# Patient Record
Sex: Female | Born: 1935 | Race: White | Hispanic: No | Smoking: Former smoker
Health system: Southern US, Community
[De-identification: ages and names within clinical notes are randomized; demographics above are authoritative.]

## PROBLEM LIST (undated history)

## (undated) DIAGNOSIS — K759 Inflammatory liver disease, unspecified: Secondary | ICD-10-CM

## (undated) DIAGNOSIS — B269 Mumps without complication: Secondary | ICD-10-CM

## (undated) DIAGNOSIS — A630 Anogenital (venereal) warts: Secondary | ICD-10-CM

## (undated) DIAGNOSIS — C50919 Malignant neoplasm of unspecified site of unspecified female breast: Secondary | ICD-10-CM

## (undated) DIAGNOSIS — Z9889 Other specified postprocedural states: Secondary | ICD-10-CM

## (undated) DIAGNOSIS — G8929 Other chronic pain: Secondary | ICD-10-CM

## (undated) DIAGNOSIS — R112 Nausea with vomiting, unspecified: Secondary | ICD-10-CM

## (undated) DIAGNOSIS — M810 Age-related osteoporosis without current pathological fracture: Secondary | ICD-10-CM

## (undated) DIAGNOSIS — A389 Scarlet fever, uncomplicated: Secondary | ICD-10-CM

## (undated) DIAGNOSIS — I499 Cardiac arrhythmia, unspecified: Secondary | ICD-10-CM

## (undated) DIAGNOSIS — E039 Hypothyroidism, unspecified: Secondary | ICD-10-CM

## (undated) DIAGNOSIS — K649 Unspecified hemorrhoids: Secondary | ICD-10-CM

## (undated) DIAGNOSIS — M1990 Unspecified osteoarthritis, unspecified site: Secondary | ICD-10-CM

## (undated) DIAGNOSIS — B059 Measles without complication: Secondary | ICD-10-CM

## (undated) DIAGNOSIS — T4145XA Adverse effect of unspecified anesthetic, initial encounter: Secondary | ICD-10-CM

## (undated) DIAGNOSIS — C4490 Unspecified malignant neoplasm of skin, unspecified: Secondary | ICD-10-CM

## (undated) DIAGNOSIS — T8859XA Other complications of anesthesia, initial encounter: Secondary | ICD-10-CM

## (undated) DIAGNOSIS — K219 Gastro-esophageal reflux disease without esophagitis: Secondary | ICD-10-CM

## (undated) DIAGNOSIS — H547 Unspecified visual loss: Secondary | ICD-10-CM

## (undated) DIAGNOSIS — K589 Irritable bowel syndrome without diarrhea: Secondary | ICD-10-CM

## (undated) DIAGNOSIS — K5792 Diverticulitis of intestine, part unspecified, without perforation or abscess without bleeding: Secondary | ICD-10-CM

## (undated) DIAGNOSIS — J939 Pneumothorax, unspecified: Secondary | ICD-10-CM

## (undated) DIAGNOSIS — J189 Pneumonia, unspecified organism: Secondary | ICD-10-CM

## (undated) HISTORY — PX: BREAST LUMPECTOMY: SHX2

## (undated) HISTORY — PX: COLONOSCOPY: SHX174

## (undated) HISTORY — PX: NASAL SEPTUM SURGERY: SHX37

## (undated) HISTORY — PX: OTHER SURGICAL HISTORY: SHX169

## (undated) HISTORY — PX: BREAST EXCISIONAL BIOPSY: SUR124

---
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
## 1994-05-23 HISTORY — PX: HEMORRHOID SURGERY: SHX153

## 1996-05-23 DIAGNOSIS — J939 Pneumothorax, unspecified: Secondary | ICD-10-CM

## 1996-05-23 HISTORY — DX: Pneumothorax, unspecified: J93.9

## 1996-05-23 HISTORY — PX: ABDOMINAL HYSTERECTOMY: SHX81

## 2000-05-23 HISTORY — PX: OTHER SURGICAL HISTORY: SHX169

## 2004-05-23 DIAGNOSIS — C50919 Malignant neoplasm of unspecified site of unspecified female breast: Secondary | ICD-10-CM

## 2004-05-23 HISTORY — PX: OTHER SURGICAL HISTORY: SHX169

## 2004-05-23 HISTORY — DX: Malignant neoplasm of unspecified site of unspecified female breast: C50.919

## 2004-11-29 ENCOUNTER — Encounter: Admission: RE | Admit: 2004-11-29 | Discharge: 2004-11-29 | Payer: Self-pay | Admitting: General Surgery

## 2005-05-23 HISTORY — PX: OTHER SURGICAL HISTORY: SHX169

## 2005-05-31 ENCOUNTER — Encounter: Admission: RE | Admit: 2005-05-31 | Discharge: 2005-05-31 | Payer: Self-pay | Admitting: General Surgery

## 2005-07-13 DIAGNOSIS — C4491 Basal cell carcinoma of skin, unspecified: Secondary | ICD-10-CM

## 2005-07-13 HISTORY — DX: Basal cell carcinoma of skin, unspecified: C44.91

## 2005-07-20 ENCOUNTER — Encounter: Payer: Self-pay | Admitting: Interventional Radiology

## 2005-07-26 ENCOUNTER — Encounter (INDEPENDENT_AMBULATORY_CARE_PROVIDER_SITE_OTHER): Payer: Self-pay | Admitting: *Deleted

## 2005-07-26 ENCOUNTER — Ambulatory Visit (HOSPITAL_COMMUNITY): Admission: RE | Admit: 2005-07-26 | Discharge: 2005-07-26 | Payer: Self-pay | Admitting: Interventional Radiology

## 2005-12-01 ENCOUNTER — Encounter: Admission: RE | Admit: 2005-12-01 | Discharge: 2005-12-01 | Payer: Self-pay | Admitting: General Surgery

## 2006-12-05 ENCOUNTER — Encounter: Admission: RE | Admit: 2006-12-05 | Discharge: 2006-12-05 | Payer: Self-pay | Admitting: General Surgery

## 2007-12-06 ENCOUNTER — Encounter: Admission: RE | Admit: 2007-12-06 | Discharge: 2007-12-06 | Payer: Self-pay | Admitting: General Surgery

## 2008-12-08 ENCOUNTER — Encounter: Admission: RE | Admit: 2008-12-08 | Discharge: 2008-12-08 | Payer: Self-pay | Admitting: General Surgery

## 2009-12-09 ENCOUNTER — Encounter: Admission: RE | Admit: 2009-12-09 | Discharge: 2009-12-09 | Payer: Self-pay | Admitting: Family Medicine
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
## 2010-11-09 ENCOUNTER — Other Ambulatory Visit: Payer: Self-pay | Admitting: Family Medicine

## 2010-11-09 DIAGNOSIS — Z9889 Other specified postprocedural states: Secondary | ICD-10-CM

## 2010-11-09 DIAGNOSIS — Z853 Personal history of malignant neoplasm of breast: Secondary | ICD-10-CM

## 2010-12-13 ENCOUNTER — Ambulatory Visit
Admission: RE | Admit: 2010-12-13 | Discharge: 2010-12-13 | Disposition: A | Discharge status: Home / Self Care | Payer: Medicare Other | Source: Ambulatory Visit | Attending: Family Medicine | Admitting: Family Medicine

## 2010-12-13 DIAGNOSIS — Z9889 Other specified postprocedural states: Secondary | ICD-10-CM

## 2010-12-13 DIAGNOSIS — Z853 Personal history of malignant neoplasm of breast: Secondary | ICD-10-CM

## 2011-06-06 DIAGNOSIS — IMO0002 Reserved for concepts with insufficient information to code with codable children: Secondary | ICD-10-CM | POA: Diagnosis not present

## 2011-06-06 DIAGNOSIS — J309 Allergic rhinitis, unspecified: Secondary | ICD-10-CM | POA: Diagnosis not present

## 2011-06-06 DIAGNOSIS — E039 Hypothyroidism, unspecified: Secondary | ICD-10-CM | POA: Diagnosis not present

## 2011-06-06 DIAGNOSIS — K5730 Diverticulosis of large intestine without perforation or abscess without bleeding: Secondary | ICD-10-CM | POA: Diagnosis not present

## 2011-06-06 DIAGNOSIS — M810 Age-related osteoporosis without current pathological fracture: Secondary | ICD-10-CM | POA: Diagnosis not present

## 2011-06-06 DIAGNOSIS — M1990 Unspecified osteoarthritis, unspecified site: Secondary | ICD-10-CM | POA: Diagnosis not present

## 2011-06-06 DIAGNOSIS — I10 Essential (primary) hypertension: Secondary | ICD-10-CM | POA: Diagnosis not present

## 2011-06-06 DIAGNOSIS — E782 Mixed hyperlipidemia: Secondary | ICD-10-CM | POA: Diagnosis not present

## 2011-06-08 DIAGNOSIS — L600 Ingrowing nail: Secondary | ICD-10-CM | POA: Diagnosis not present

## 2011-06-08 DIAGNOSIS — M79609 Pain in unspecified limb: Secondary | ICD-10-CM | POA: Diagnosis not present

## 2011-06-22 DIAGNOSIS — L03039 Cellulitis of unspecified toe: Secondary | ICD-10-CM | POA: Diagnosis not present

## 2011-06-24 DIAGNOSIS — K5732 Diverticulitis of large intestine without perforation or abscess without bleeding: Secondary | ICD-10-CM | POA: Diagnosis not present

## 2011-08-01 ENCOUNTER — Other Ambulatory Visit: Payer: Self-pay | Admitting: Dermatology

## 2011-08-01 DIAGNOSIS — D485 Neoplasm of uncertain behavior of skin: Secondary | ICD-10-CM | POA: Diagnosis not present

## 2011-08-01 DIAGNOSIS — L570 Actinic keratosis: Secondary | ICD-10-CM | POA: Diagnosis not present

## 2011-08-01 DIAGNOSIS — L820 Inflamed seborrheic keratosis: Secondary | ICD-10-CM | POA: Diagnosis not present

## 2011-10-11 DIAGNOSIS — E782 Mixed hyperlipidemia: Secondary | ICD-10-CM | POA: Diagnosis not present

## 2011-10-11 DIAGNOSIS — R5383 Other fatigue: Secondary | ICD-10-CM | POA: Diagnosis not present

## 2011-10-11 DIAGNOSIS — E039 Hypothyroidism, unspecified: Secondary | ICD-10-CM | POA: Diagnosis not present

## 2011-10-11 DIAGNOSIS — I10 Essential (primary) hypertension: Secondary | ICD-10-CM | POA: Diagnosis not present

## 2011-10-18 DIAGNOSIS — M1990 Unspecified osteoarthritis, unspecified site: Secondary | ICD-10-CM | POA: Diagnosis not present

## 2011-10-18 DIAGNOSIS — J309 Allergic rhinitis, unspecified: Secondary | ICD-10-CM | POA: Diagnosis not present

## 2011-10-18 DIAGNOSIS — K5730 Diverticulosis of large intestine without perforation or abscess without bleeding: Secondary | ICD-10-CM | POA: Diagnosis not present

## 2011-10-18 DIAGNOSIS — E782 Mixed hyperlipidemia: Secondary | ICD-10-CM | POA: Diagnosis not present

## 2011-10-18 DIAGNOSIS — M810 Age-related osteoporosis without current pathological fracture: Secondary | ICD-10-CM | POA: Diagnosis not present

## 2011-10-18 DIAGNOSIS — I10 Essential (primary) hypertension: Secondary | ICD-10-CM | POA: Diagnosis not present

## 2011-10-18 DIAGNOSIS — IMO0002 Reserved for concepts with insufficient information to code with codable children: Secondary | ICD-10-CM | POA: Diagnosis not present

## 2011-10-18 DIAGNOSIS — E039 Hypothyroidism, unspecified: Secondary | ICD-10-CM | POA: Diagnosis not present

## 2011-10-21 DIAGNOSIS — L600 Ingrowing nail: Secondary | ICD-10-CM | POA: Diagnosis not present

## 2011-10-21 DIAGNOSIS — M79609 Pain in unspecified limb: Secondary | ICD-10-CM | POA: Diagnosis not present

## 2011-11-08 ENCOUNTER — Other Ambulatory Visit: Payer: Self-pay | Admitting: Family Medicine

## 2011-11-08 DIAGNOSIS — Z853 Personal history of malignant neoplasm of breast: Secondary | ICD-10-CM

## 2011-11-11 DIAGNOSIS — L600 Ingrowing nail: Secondary | ICD-10-CM | POA: Diagnosis not present

## 2011-11-11 DIAGNOSIS — M79609 Pain in unspecified limb: Secondary | ICD-10-CM | POA: Diagnosis not present

## 2011-12-02 DIAGNOSIS — L600 Ingrowing nail: Secondary | ICD-10-CM | POA: Diagnosis not present

## 2011-12-02 DIAGNOSIS — M79609 Pain in unspecified limb: Secondary | ICD-10-CM | POA: Diagnosis not present

## 2011-12-14 ENCOUNTER — Ambulatory Visit
Admission: RE | Admit: 2011-12-14 | Discharge: 2011-12-14 | Disposition: A | Discharge status: Home / Self Care | Payer: Medicare Other | Source: Ambulatory Visit | Attending: Family Medicine | Admitting: Family Medicine

## 2011-12-14 DIAGNOSIS — Z853 Personal history of malignant neoplasm of breast: Secondary | ICD-10-CM

## 2011-12-19 DIAGNOSIS — E039 Hypothyroidism, unspecified: Secondary | ICD-10-CM | POA: Diagnosis not present

## 2011-12-19 DIAGNOSIS — J309 Allergic rhinitis, unspecified: Secondary | ICD-10-CM | POA: Diagnosis not present

## 2011-12-19 DIAGNOSIS — I10 Essential (primary) hypertension: Secondary | ICD-10-CM | POA: Diagnosis not present

## 2011-12-19 DIAGNOSIS — E782 Mixed hyperlipidemia: Secondary | ICD-10-CM | POA: Diagnosis not present

## 2011-12-19 DIAGNOSIS — M1990 Unspecified osteoarthritis, unspecified site: Secondary | ICD-10-CM | POA: Diagnosis not present

## 2011-12-19 DIAGNOSIS — M810 Age-related osteoporosis without current pathological fracture: Secondary | ICD-10-CM | POA: Diagnosis not present

## 2011-12-19 DIAGNOSIS — K5730 Diverticulosis of large intestine without perforation or abscess without bleeding: Secondary | ICD-10-CM | POA: Diagnosis not present

## 2011-12-19 DIAGNOSIS — IMO0002 Reserved for concepts with insufficient information to code with codable children: Secondary | ICD-10-CM | POA: Diagnosis not present

## 2012-02-23 DIAGNOSIS — Z23 Encounter for immunization: Secondary | ICD-10-CM | POA: Diagnosis not present

## 2012-03-21 DIAGNOSIS — E782 Mixed hyperlipidemia: Secondary | ICD-10-CM | POA: Diagnosis not present

## 2012-03-21 DIAGNOSIS — M1990 Unspecified osteoarthritis, unspecified site: Secondary | ICD-10-CM | POA: Diagnosis not present

## 2012-03-21 DIAGNOSIS — I10 Essential (primary) hypertension: Secondary | ICD-10-CM | POA: Diagnosis not present

## 2012-03-21 DIAGNOSIS — K5730 Diverticulosis of large intestine without perforation or abscess without bleeding: Secondary | ICD-10-CM | POA: Diagnosis not present

## 2012-03-21 DIAGNOSIS — IMO0002 Reserved for concepts with insufficient information to code with codable children: Secondary | ICD-10-CM | POA: Diagnosis not present

## 2012-03-21 DIAGNOSIS — M810 Age-related osteoporosis without current pathological fracture: Secondary | ICD-10-CM | POA: Diagnosis not present

## 2012-03-21 DIAGNOSIS — E039 Hypothyroidism, unspecified: Secondary | ICD-10-CM | POA: Diagnosis not present

## 2012-03-21 DIAGNOSIS — J309 Allergic rhinitis, unspecified: Secondary | ICD-10-CM | POA: Diagnosis not present

## 2012-05-03 DIAGNOSIS — J309 Allergic rhinitis, unspecified: Secondary | ICD-10-CM | POA: Diagnosis not present

## 2012-05-03 DIAGNOSIS — M810 Age-related osteoporosis without current pathological fracture: Secondary | ICD-10-CM | POA: Diagnosis not present

## 2012-05-03 DIAGNOSIS — K5730 Diverticulosis of large intestine without perforation or abscess without bleeding: Secondary | ICD-10-CM | POA: Diagnosis not present

## 2012-05-03 DIAGNOSIS — I10 Essential (primary) hypertension: Secondary | ICD-10-CM | POA: Diagnosis not present

## 2012-05-03 DIAGNOSIS — E782 Mixed hyperlipidemia: Secondary | ICD-10-CM | POA: Diagnosis not present

## 2012-05-03 DIAGNOSIS — IMO0002 Reserved for concepts with insufficient information to code with codable children: Secondary | ICD-10-CM | POA: Diagnosis not present

## 2012-05-03 DIAGNOSIS — M1990 Unspecified osteoarthritis, unspecified site: Secondary | ICD-10-CM | POA: Diagnosis not present

## 2012-05-03 DIAGNOSIS — E039 Hypothyroidism, unspecified: Secondary | ICD-10-CM | POA: Diagnosis not present

## 2012-07-10 ENCOUNTER — Other Ambulatory Visit: Payer: Self-pay | Admitting: Dermatology

## 2012-07-10 DIAGNOSIS — C44319 Basal cell carcinoma of skin of other parts of face: Secondary | ICD-10-CM | POA: Diagnosis not present

## 2012-07-10 DIAGNOSIS — L570 Actinic keratosis: Secondary | ICD-10-CM | POA: Diagnosis not present

## 2012-07-10 DIAGNOSIS — C4491 Basal cell carcinoma of skin, unspecified: Secondary | ICD-10-CM

## 2012-07-10 DIAGNOSIS — L820 Inflamed seborrheic keratosis: Secondary | ICD-10-CM | POA: Diagnosis not present

## 2012-07-10 HISTORY — DX: Basal cell carcinoma of skin, unspecified: C44.91

## 2012-07-20 DIAGNOSIS — E039 Hypothyroidism, unspecified: Secondary | ICD-10-CM | POA: Diagnosis not present

## 2012-07-20 DIAGNOSIS — I10 Essential (primary) hypertension: Secondary | ICD-10-CM | POA: Diagnosis not present

## 2012-07-20 DIAGNOSIS — E782 Mixed hyperlipidemia: Secondary | ICD-10-CM | POA: Diagnosis not present

## 2012-08-03 DIAGNOSIS — I739 Peripheral vascular disease, unspecified: Secondary | ICD-10-CM | POA: Diagnosis not present

## 2012-08-31 DIAGNOSIS — B351 Tinea unguium: Secondary | ICD-10-CM | POA: Diagnosis not present

## 2012-08-31 DIAGNOSIS — I70209 Unspecified atherosclerosis of native arteries of extremities, unspecified extremity: Secondary | ICD-10-CM | POA: Diagnosis not present

## 2012-08-31 DIAGNOSIS — L851 Acquired keratosis [keratoderma] palmaris et plantaris: Secondary | ICD-10-CM | POA: Diagnosis not present

## 2012-10-09 DIAGNOSIS — H2510 Age-related nuclear cataract, unspecified eye: Secondary | ICD-10-CM | POA: Diagnosis not present

## 2012-10-09 DIAGNOSIS — H25019 Cortical age-related cataract, unspecified eye: Secondary | ICD-10-CM | POA: Diagnosis not present

## 2012-11-06 DIAGNOSIS — L821 Other seborrheic keratosis: Secondary | ICD-10-CM | POA: Diagnosis not present

## 2012-11-06 DIAGNOSIS — D239 Other benign neoplasm of skin, unspecified: Secondary | ICD-10-CM | POA: Diagnosis not present

## 2012-11-08 ENCOUNTER — Other Ambulatory Visit: Payer: Self-pay | Admitting: Family Medicine

## 2012-11-08 DIAGNOSIS — Z853 Personal history of malignant neoplasm of breast: Secondary | ICD-10-CM

## 2012-11-09 DIAGNOSIS — L851 Acquired keratosis [keratoderma] palmaris et plantaris: Secondary | ICD-10-CM | POA: Diagnosis not present

## 2012-11-09 DIAGNOSIS — B351 Tinea unguium: Secondary | ICD-10-CM | POA: Diagnosis not present

## 2012-11-09 DIAGNOSIS — I70209 Unspecified atherosclerosis of native arteries of extremities, unspecified extremity: Secondary | ICD-10-CM | POA: Diagnosis not present

## 2012-12-03 DIAGNOSIS — K589 Irritable bowel syndrome without diarrhea: Secondary | ICD-10-CM | POA: Diagnosis not present

## 2012-12-03 DIAGNOSIS — M810 Age-related osteoporosis without current pathological fracture: Secondary | ICD-10-CM | POA: Diagnosis not present

## 2012-12-03 DIAGNOSIS — I10 Essential (primary) hypertension: Secondary | ICD-10-CM | POA: Diagnosis not present

## 2012-12-03 DIAGNOSIS — M1990 Unspecified osteoarthritis, unspecified site: Secondary | ICD-10-CM | POA: Diagnosis not present

## 2012-12-03 DIAGNOSIS — IMO0002 Reserved for concepts with insufficient information to code with codable children: Secondary | ICD-10-CM | POA: Diagnosis not present

## 2012-12-03 DIAGNOSIS — E782 Mixed hyperlipidemia: Secondary | ICD-10-CM | POA: Diagnosis not present

## 2012-12-03 DIAGNOSIS — E039 Hypothyroidism, unspecified: Secondary | ICD-10-CM | POA: Diagnosis not present

## 2012-12-03 DIAGNOSIS — I739 Peripheral vascular disease, unspecified: Secondary | ICD-10-CM | POA: Diagnosis not present

## 2012-12-12 DIAGNOSIS — M1710 Unilateral primary osteoarthritis, unspecified knee: Secondary | ICD-10-CM | POA: Diagnosis not present

## 2012-12-14 ENCOUNTER — Ambulatory Visit
Admission: RE | Admit: 2012-12-14 | Discharge: 2012-12-14 | Disposition: A | Discharge status: Home / Self Care | Payer: Medicare Other | Source: Ambulatory Visit | Attending: Family Medicine | Admitting: Family Medicine

## 2012-12-14 DIAGNOSIS — Z853 Personal history of malignant neoplasm of breast: Secondary | ICD-10-CM | POA: Diagnosis not present

## 2013-01-15 DIAGNOSIS — M1710 Unilateral primary osteoarthritis, unspecified knee: Secondary | ICD-10-CM | POA: Diagnosis not present

## 2013-02-01 DIAGNOSIS — I70209 Unspecified atherosclerosis of native arteries of extremities, unspecified extremity: Secondary | ICD-10-CM | POA: Diagnosis not present

## 2013-02-01 DIAGNOSIS — L851 Acquired keratosis [keratoderma] palmaris et plantaris: Secondary | ICD-10-CM | POA: Diagnosis not present

## 2013-02-01 DIAGNOSIS — B351 Tinea unguium: Secondary | ICD-10-CM | POA: Diagnosis not present

## 2013-02-13 DIAGNOSIS — Z23 Encounter for immunization: Secondary | ICD-10-CM | POA: Diagnosis not present

## 2013-02-15 ENCOUNTER — Encounter (HOSPITAL_COMMUNITY): Payer: Self-pay | Admitting: Pharmacy Technician

## 2013-02-15 NOTE — Progress Notes (Signed)
Need orders in EPIC.  Surgery scheduled for 03/04/13.  preop on 02/21/13 at 200pm.  Thank You.

## 2013-02-18 ENCOUNTER — Other Ambulatory Visit: Payer: Self-pay | Admitting: Orthopedic Surgery

## 2013-02-20 NOTE — Patient Instructions (Addendum)
20 WILSON SAMPLE  02/20/2013   Your procedure is scheduled on: 03-04-2013  Report to [facility] at 1205 PM  Call this number if you have problems the morning of surgery [phone]   Remember: you will  Have you blood type drawn day of surgery in short stay.    Do not eat food  :After Midnight.   clear liquids midnight until 905 am day of surgery, then nothing by mouth.   Take these medicines the morning of surgery with A SIP OF WATER: levothryoid                                [facility] FOR [facility]             You may not have any metal on your body including hair pins and piercings  Do not wear jewelry, make-up.  Do not wear lotions, powders, or perfumes. You may wear deodorant.   Men may shave face and neck.  Do not bring valuables to the hospital. [facility] IS NOT RESPONSIBLE FOR VALUEABLES.  Contacts, dentures or bridgework may not be worn into surgery.  Leave suitcase in the car. After surgery it may be brought to your room.  For patients admitted to the hospital, checkout time is 11:00 AM the day of discharge.   Patients discharged the day of surgery will not be allowed to drive home.  Name and phone number of your driver:  Special Instructions: N/A   Please read over the following fact sheets that you were given:   Call Cain Sieve RN pre op nurse if needed [phone]    FAILURE TO FOLLOW THESE INSTRUCTIONS MAY RESULT IN THE CANCELLATION OF YOUR SURGERY.  PATIENT SIGNATURE___________________________________________  NURSE SIGNATURE_____________________________________________

## 2013-02-21 ENCOUNTER — Encounter (HOSPITAL_COMMUNITY): Payer: Self-pay

## 2013-02-21 ENCOUNTER — Encounter (HOSPITAL_COMMUNITY)
Admission: RE | Admit: 2013-02-21 | Discharge: 2013-02-21 | Disposition: A | Discharge status: Home / Self Care | Payer: Medicare Other | Source: Ambulatory Visit | Attending: Orthopedic Surgery | Admitting: Orthopedic Surgery

## 2013-02-21 ENCOUNTER — Ambulatory Visit (HOSPITAL_COMMUNITY)
Admission: RE | Admit: 2013-02-21 | Discharge: 2013-02-21 | Disposition: A | Discharge status: Home / Self Care | Payer: Medicare Other | Source: Ambulatory Visit | Attending: Orthopedic Surgery | Admitting: Orthopedic Surgery

## 2013-02-21 DIAGNOSIS — I771 Stricture of artery: Secondary | ICD-10-CM | POA: Diagnosis not present

## 2013-02-21 DIAGNOSIS — M1710 Unilateral primary osteoarthritis, unspecified knee: Secondary | ICD-10-CM | POA: Insufficient documentation

## 2013-02-21 DIAGNOSIS — M4120 Other idiopathic scoliosis, site unspecified: Secondary | ICD-10-CM | POA: Diagnosis not present

## 2013-02-21 DIAGNOSIS — Z01810 Encounter for preprocedural cardiovascular examination: Secondary | ICD-10-CM | POA: Insufficient documentation

## 2013-02-21 DIAGNOSIS — Z01812 Encounter for preprocedural laboratory examination: Secondary | ICD-10-CM | POA: Diagnosis not present

## 2013-02-21 DIAGNOSIS — Z01818 Encounter for other preprocedural examination: Secondary | ICD-10-CM | POA: Diagnosis not present

## 2013-02-21 HISTORY — DX: Pneumothorax, unspecified: J93.9

## 2013-02-21 HISTORY — DX: Scarlet fever, uncomplicated: A38.9

## 2013-02-21 HISTORY — DX: Unspecified visual loss: H54.7

## 2013-02-21 HISTORY — DX: Malignant neoplasm of unspecified site of unspecified female breast: C50.919

## 2013-02-21 HISTORY — DX: Other chronic pain: G89.29

## 2013-02-21 HISTORY — DX: Other specified postprocedural states: Z98.890

## 2013-02-21 HISTORY — DX: Nausea with vomiting, unspecified: R11.2

## 2013-02-21 HISTORY — DX: Measles without complication: B05.9

## 2013-02-21 HISTORY — DX: Age-related osteoporosis without current pathological fracture: M81.0

## 2013-02-21 HISTORY — DX: Diverticulitis of intestine, part unspecified, without perforation or abscess without bleeding: K57.92

## 2013-02-21 HISTORY — DX: Unspecified osteoarthritis, unspecified site: M19.90

## 2013-02-21 HISTORY — DX: Inflammatory liver disease, unspecified: K75.9

## 2013-02-21 HISTORY — DX: Unspecified malignant neoplasm of skin, unspecified: C44.90

## 2013-02-21 HISTORY — DX: Unspecified hemorrhoids: K64.9

## 2013-02-21 HISTORY — DX: Gastro-esophageal reflux disease without esophagitis: K21.9

## 2013-02-21 HISTORY — DX: Mumps without complication: B26.9

## 2013-02-21 HISTORY — DX: Adverse effect of unspecified anesthetic, initial encounter: T41.45XA

## 2013-02-21 HISTORY — DX: Other complications of anesthesia, initial encounter: T88.59XA

## 2013-02-21 HISTORY — DX: Anogenital (venereal) warts: A63.0

## 2013-02-21 HISTORY — DX: Hypothyroidism, unspecified: E03.9

## 2013-02-21 HISTORY — DX: Irritable bowel syndrome, unspecified: K58.9

## 2013-02-21 LAB — CBC
Hemoglobin: 12.3 g/dL (ref 12.0–15.0)
MCH: 29.1 pg (ref 26.0–34.0)
Platelets: 253 10*3/uL (ref 150–400)
RBC: 4.22 MIL/uL (ref 3.87–5.11)
RDW: 13.8 % (ref 11.5–15.5)
WBC: 6.7 10*3/uL (ref 4.0–10.5)

## 2013-02-21 LAB — COMPREHENSIVE METABOLIC PANEL
ALT: 16 U/L (ref 0–35)
AST: 16 U/L (ref 0–37)
Albumin: 3.4 g/dL — ABNORMAL LOW (ref 3.5–5.2)
Alkaline Phosphatase: 85 U/L (ref 39–117)
CO2: 29 mEq/L (ref 19–32)
Calcium: 9.4 mg/dL (ref 8.4–10.5)
GFR calc non Af Amer: 57 mL/min — ABNORMAL LOW (ref >90)
Glucose, Bld: 94 mg/dL (ref 70–99)
Potassium: 4.2 mEq/L (ref 3.5–5.1)
Sodium: 140 mEq/L (ref 135–145)
Total Protein: 7.3 g/dL (ref 6.0–8.3)

## 2013-02-21 LAB — URINE MICROSCOPIC-ADD ON

## 2013-02-21 LAB — SURGICAL PCR SCREEN
MRSA, PCR: NEGATIVE
Staphylococcus aureus: NEGATIVE

## 2013-02-21 LAB — URINALYSIS, ROUTINE W REFLEX MICROSCOPIC
Glucose, UA: NEGATIVE mg/dL
Hgb urine dipstick: NEGATIVE
Ketones, ur: NEGATIVE mg/dL
Nitrite: NEGATIVE
Specific Gravity, Urine: 1.016 (ref 1.005–1.030)
pH: 6 (ref 5.0–8.0)

## 2013-02-21 LAB — PROTIME-INR: INR: 0.92 (ref 0.00–1.49)

## 2013-02-21 LAB — APTT: aPTT: 32 seconds (ref 24–37)

## 2013-02-21 NOTE — Progress Notes (Signed)
Micro ua results faxed by epic to dr aluisio  

## 2013-02-25 NOTE — Progress Notes (Signed)
FAX RECEIVED AND PLACED ON CHART NO ACTION ON MICRO UA RESULTS PER DREW PERKINS PA

## 2013-02-27 NOTE — Progress Notes (Signed)
Spoke with pt son mark Deeley made aware surgery time changed to 820 am, npo after midnight arrive 600 am 03-04-13  [facility] short stay. No answer at patient phone home number.

## 2013-03-03 ENCOUNTER — Other Ambulatory Visit: Payer: Self-pay | Admitting: Orthopedic Surgery
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
## 2013-03-04 ENCOUNTER — Encounter (HOSPITAL_COMMUNITY): Payer: Medicare Other | Admitting: Anesthesiology

## 2013-03-04 ENCOUNTER — Encounter (HOSPITAL_COMMUNITY)
Admission: RE | Disposition: A | Discharge status: Home Health | Payer: Self-pay | Source: Ambulatory Visit | Attending: Orthopedic Surgery

## 2013-03-04 ENCOUNTER — Inpatient Hospital Stay (HOSPITAL_COMMUNITY)
Admission: RE | Admit: 2013-03-04 | Discharge: 2013-03-06 | DRG: 470 | Disposition: A | Discharge status: Home Health | Payer: Medicare Other | Source: Ambulatory Visit | Attending: Orthopedic Surgery | Admitting: Orthopedic Surgery

## 2013-03-04 ENCOUNTER — Encounter (HOSPITAL_COMMUNITY): Payer: Self-pay | Admitting: *Deleted

## 2013-03-04 ENCOUNTER — Inpatient Hospital Stay (HOSPITAL_COMMUNITY): Payer: Medicare Other | Admitting: Anesthesiology

## 2013-03-04 DIAGNOSIS — IMO0002 Reserved for concepts with insufficient information to code with codable children: Secondary | ICD-10-CM | POA: Diagnosis not present

## 2013-03-04 DIAGNOSIS — Z853 Personal history of malignant neoplasm of breast: Secondary | ICD-10-CM

## 2013-03-04 DIAGNOSIS — Z87891 Personal history of nicotine dependence: Secondary | ICD-10-CM | POA: Diagnosis not present

## 2013-03-04 DIAGNOSIS — M1710 Unilateral primary osteoarthritis, unspecified knee: Secondary | ICD-10-CM | POA: Diagnosis not present

## 2013-03-04 DIAGNOSIS — G8929 Other chronic pain: Secondary | ICD-10-CM | POA: Diagnosis not present

## 2013-03-04 DIAGNOSIS — H5460 Unqualified visual loss, one eye, unspecified: Secondary | ICD-10-CM | POA: Diagnosis present

## 2013-03-04 DIAGNOSIS — E039 Hypothyroidism, unspecified: Secondary | ICD-10-CM | POA: Diagnosis not present

## 2013-03-04 DIAGNOSIS — M179 Osteoarthritis of knee, unspecified: Secondary | ICD-10-CM | POA: Diagnosis present

## 2013-03-04 DIAGNOSIS — M810 Age-related osteoporosis without current pathological fracture: Secondary | ICD-10-CM | POA: Diagnosis present

## 2013-03-04 DIAGNOSIS — I10 Essential (primary) hypertension: Secondary | ICD-10-CM | POA: Diagnosis present

## 2013-03-04 DIAGNOSIS — M25569 Pain in unspecified knee: Secondary | ICD-10-CM | POA: Diagnosis not present

## 2013-03-04 DIAGNOSIS — K589 Irritable bowel syndrome without diarrhea: Secondary | ICD-10-CM | POA: Diagnosis not present

## 2013-03-04 DIAGNOSIS — Z96651 Presence of right artificial knee joint: Secondary | ICD-10-CM

## 2013-03-04 DIAGNOSIS — K5732 Diverticulitis of large intestine without perforation or abscess without bleeding: Secondary | ICD-10-CM | POA: Insufficient documentation

## 2013-03-04 DIAGNOSIS — K219 Gastro-esophageal reflux disease without esophagitis: Secondary | ICD-10-CM | POA: Diagnosis not present

## 2013-03-04 HISTORY — PX: TOTAL KNEE ARTHROPLASTY: SHX125

## 2013-03-04 LAB — ABO/RH: ABO/RH(D): A POS

## 2013-03-04 LAB — TYPE AND SCREEN

## 2013-03-04 SURGERY — ARTHROPLASTY, KNEE, TOTAL
Anesthesia: Spinal | Site: Knee | Laterality: Right

## 2013-03-04 MED ORDER — HYDROMORPHONE HCL PF 1 MG/ML IJ SOLN: 0.2500 mg | INTRAMUSCULAR | Status: DC | PRN

## 2013-03-04 MED ORDER — DIPHENHYDRAMINE HCL 12.5 MG/5ML PO ELIX: 12.5000 mg | ORAL_SOLUTION | ORAL | Status: DC | PRN

## 2013-03-04 MED ORDER — CEFAZOLIN SODIUM-DEXTROSE 2-3 GM-% IV SOLR
2.0000 g | INTRAVENOUS | Status: AC
  Administered 2013-03-04: 2 g via INTRAVENOUS

## 2013-03-04 MED ORDER — DEXTROSE-NACL 5-0.9 % IV SOLN
INTRAVENOUS | Status: DC
  Administered 2013-03-04 – 2013-03-05 (×3): via INTRAVENOUS

## 2013-03-04 MED ORDER — BUPIVACAINE HCL 0.25 % IJ SOLN
INTRAMUSCULAR | Status: DC | PRN
  Administered 2013-03-04: 30 mL

## 2013-03-04 MED ORDER — TRAMADOL HCL 50 MG PO TABS: 50.0000 mg | ORAL_TABLET | Freq: Four times a day (QID) | ORAL | Status: DC | PRN

## 2013-03-04 MED ORDER — CEFAZOLIN SODIUM-DEXTROSE 2-3 GM-% IV SOLR
INTRAVENOUS | Status: AC
  Filled 2013-03-04: qty 50

## 2013-03-04 MED ORDER — SODIUM CHLORIDE 0.9 % IJ SOLN
INTRAMUSCULAR | Status: AC
  Filled 2013-03-04: qty 50

## 2013-03-04 MED ORDER — LORATADINE 10 MG PO TABS
10.0000 mg | ORAL_TABLET | Freq: Every day | ORAL | Status: DC
  Administered 2013-03-06: 10 mg via ORAL
  Filled 2013-03-04 (×3): qty 1

## 2013-03-04 MED ORDER — CHLORHEXIDINE GLUCONATE 4 % EX LIQD
60.0000 mL | Freq: Once | CUTANEOUS | Status: DC
  Filled 2013-03-04: qty 60

## 2013-03-04 MED ORDER — 0.9 % SODIUM CHLORIDE (POUR BTL) OPTIME
TOPICAL | Status: DC | PRN
  Administered 2013-03-04: 1000 mL

## 2013-03-04 MED ORDER — FLEET ENEMA 7-19 GM/118ML RE ENEM: 1.0000 | ENEMA | Freq: Once | RECTAL | Status: AC | PRN

## 2013-03-04 MED ORDER — POLYETHYLENE GLYCOL 3350 17 G PO PACK: 17.0000 g | PACK | Freq: Every day | ORAL | Status: DC | PRN

## 2013-03-04 MED ORDER — BUPIVACAINE LIPOSOME 1.3 % IJ SUSP
20.0000 mL | Freq: Once | INTRAMUSCULAR | Status: DC
  Filled 2013-03-04: qty 20

## 2013-03-04 MED ORDER — LACTATED RINGERS IV SOLN
INTRAVENOUS | Status: DC | PRN
  Administered 2013-03-04 (×2): via INTRAVENOUS

## 2013-03-04 MED ORDER — SODIUM CHLORIDE 0.9 % IV SOLN: INTRAVENOUS | Status: DC

## 2013-03-04 MED ORDER — ACETAMINOPHEN 500 MG PO TABS
1000.0000 mg | ORAL_TABLET | Freq: Four times a day (QID) | ORAL | Status: AC
  Administered 2013-03-04 – 2013-03-05 (×4): 1000 mg via ORAL
  Filled 2013-03-04 (×4): qty 2

## 2013-03-04 MED ORDER — METHOCARBAMOL 500 MG PO TABS
500.0000 mg | ORAL_TABLET | Freq: Four times a day (QID) | ORAL | Status: DC | PRN
  Administered 2013-03-05 (×3): 500 mg via ORAL
  Filled 2013-03-04 (×3): qty 1

## 2013-03-04 MED ORDER — DEXTROSE 5 % IV SOLN
500.0000 mg | Freq: Four times a day (QID) | INTRAVENOUS | Status: DC | PRN
  Administered 2013-03-04: 500 mg via INTRAVENOUS
  Filled 2013-03-04: qty 5

## 2013-03-04 MED ORDER — AMITRIPTYLINE HCL 10 MG PO TABS
20.0000 mg | ORAL_TABLET | Freq: Every day | ORAL | Status: DC
  Administered 2013-03-04 – 2013-03-05 (×2): 20 mg via ORAL
  Filled 2013-03-04: qty 2
  Filled 2013-03-04: qty 1
  Filled 2013-03-04 (×2): qty 2

## 2013-03-04 MED ORDER — SODIUM CHLORIDE 0.9 % IJ SOLN
INTRAMUSCULAR | Status: DC | PRN
  Administered 2013-03-04: 09:00:00

## 2013-03-04 MED ORDER — OXYCODONE HCL 5 MG PO TABS
5.0000 mg | ORAL_TABLET | ORAL | Status: DC | PRN
  Administered 2013-03-04 – 2013-03-06 (×10): 10 mg via ORAL
  Filled 2013-03-04 (×10): qty 2

## 2013-03-04 MED ORDER — MORPHINE SULFATE 2 MG/ML IJ SOLN
1.0000 mg | INTRAMUSCULAR | Status: DC | PRN
  Administered 2013-03-04: 13:00:00 2 mg via INTRAVENOUS
  Filled 2013-03-04: qty 1

## 2013-03-04 MED ORDER — LEVOTHYROXINE SODIUM 75 MCG PO TABS
75.0000 ug | ORAL_TABLET | Freq: Every day | ORAL | Status: DC
  Administered 2013-03-05 – 2013-03-06 (×2): 75 ug via ORAL
  Filled 2013-03-04 (×3): qty 1

## 2013-03-04 MED ORDER — DEXAMETHASONE 6 MG PO TABS
10.0000 mg | ORAL_TABLET | Freq: Every day | ORAL | Status: AC
  Administered 2013-03-05: 10 mg via ORAL
  Filled 2013-03-04: qty 1

## 2013-03-04 MED ORDER — CEFAZOLIN SODIUM 1-5 GM-% IV SOLN
1.0000 g | Freq: Four times a day (QID) | INTRAVENOUS | Status: AC
  Administered 2013-03-04 (×2): 1 g via INTRAVENOUS
  Filled 2013-03-04 (×2): qty 50

## 2013-03-04 MED ORDER — BUPIVACAINE HCL (PF) 0.25 % IJ SOLN
INTRAMUSCULAR | Status: AC
  Filled 2013-03-04: qty 30

## 2013-03-04 MED ORDER — BUPIVACAINE IN DEXTROSE 0.75-8.25 % IT SOLN
INTRATHECAL | Status: DC | PRN
  Administered 2013-03-04: 2 mL via INTRATHECAL

## 2013-03-04 MED ORDER — METOCLOPRAMIDE HCL 10 MG PO TABS: 5.0000 mg | ORAL_TABLET | Freq: Three times a day (TID) | ORAL | Status: DC | PRN

## 2013-03-04 MED ORDER — ONDANSETRON HCL 4 MG/2ML IJ SOLN
INTRAMUSCULAR | Status: DC | PRN
  Administered 2013-03-04: 4 mg via INTRAMUSCULAR

## 2013-03-04 MED ORDER — KETOROLAC TROMETHAMINE 15 MG/ML IJ SOLN: 7.5000 mg | Freq: Four times a day (QID) | INTRAMUSCULAR | Status: AC | PRN

## 2013-03-04 MED ORDER — PROMETHAZINE HCL 25 MG/ML IJ SOLN: 6.2500 mg | INTRAMUSCULAR | Status: DC | PRN

## 2013-03-04 MED ORDER — PROPOFOL INFUSION 10 MG/ML OPTIME
INTRAVENOUS | Status: DC | PRN
  Administered 2013-03-04: 50 ug/kg/min via INTRAVENOUS

## 2013-03-04 MED ORDER — MENTHOL 3 MG MT LOZG: 1.0000 | LOZENGE | OROMUCOSAL | Status: DC | PRN

## 2013-03-04 MED ORDER — METOCLOPRAMIDE HCL 5 MG/ML IJ SOLN: 5.0000 mg | Freq: Three times a day (TID) | INTRAMUSCULAR | Status: DC | PRN

## 2013-03-04 MED ORDER — DEXAMETHASONE SODIUM PHOSPHATE 10 MG/ML IJ SOLN
10.0000 mg | Freq: Once | INTRAMUSCULAR | Status: AC
  Administered 2013-03-04: 10 mg via INTRAVENOUS

## 2013-03-04 MED ORDER — DEXAMETHASONE SODIUM PHOSPHATE 10 MG/ML IJ SOLN
10.0000 mg | Freq: Every day | INTRAMUSCULAR | Status: AC
  Filled 2013-03-04: qty 1

## 2013-03-04 MED ORDER — SODIUM CHLORIDE 0.9 % IR SOLN
Status: DC | PRN
  Administered 2013-03-04: 1000 mL

## 2013-03-04 MED ORDER — PHENOL 1.4 % MT LIQD: 1.0000 | OROMUCOSAL | Status: DC | PRN

## 2013-03-04 MED ORDER — METOPROLOL SUCCINATE ER 100 MG PO TB24
100.0000 mg | ORAL_TABLET | Freq: Every evening | ORAL | Status: DC
  Administered 2013-03-05: 100 mg via ORAL
  Filled 2013-03-04 (×3): qty 1

## 2013-03-04 MED ORDER — ONDANSETRON HCL 4 MG PO TABS: 4.0000 mg | ORAL_TABLET | Freq: Four times a day (QID) | ORAL | Status: DC | PRN

## 2013-03-04 MED ORDER — BISACODYL 10 MG RE SUPP: 10.0000 mg | Freq: Every day | RECTAL | Status: DC | PRN

## 2013-03-04 MED ORDER — MIDAZOLAM HCL 5 MG/5ML IJ SOLN
INTRAMUSCULAR | Status: DC | PRN
  Administered 2013-03-04 (×2): 1 mg via INTRAVENOUS

## 2013-03-04 MED ORDER — RIVAROXABAN 10 MG PO TABS
10.0000 mg | ORAL_TABLET | Freq: Every day | ORAL | Status: DC
  Administered 2013-03-05 – 2013-03-06 (×2): 10 mg via ORAL
  Filled 2013-03-04 (×3): qty 1

## 2013-03-04 MED ORDER — FAMOTIDINE 20 MG PO TABS
20.0000 mg | ORAL_TABLET | Freq: Every day | ORAL | Status: DC
  Administered 2013-03-06: 20 mg via ORAL
  Filled 2013-03-04 (×3): qty 1

## 2013-03-04 MED ORDER — ACETAMINOPHEN 500 MG PO TABS
1000.0000 mg | ORAL_TABLET | Freq: Once | ORAL | Status: AC
  Administered 2013-03-04: 1000 mg via ORAL
  Filled 2013-03-04: qty 2

## 2013-03-04 MED ORDER — ONDANSETRON HCL 4 MG/2ML IJ SOLN: 4.0000 mg | Freq: Four times a day (QID) | INTRAMUSCULAR | Status: DC | PRN

## 2013-03-04 MED ORDER — DOCUSATE SODIUM 100 MG PO CAPS
100.0000 mg | ORAL_CAPSULE | Freq: Two times a day (BID) | ORAL | Status: DC
  Administered 2013-03-05 – 2013-03-06 (×3): 100 mg via ORAL

## 2013-03-04 MED ORDER — EPHEDRINE SULFATE 50 MG/ML IJ SOLN
INTRAMUSCULAR | Status: DC | PRN
  Administered 2013-03-04 (×2): 5 mg via INTRAVENOUS

## 2013-03-04 SURGICAL SUPPLY — 57 items
BAG SPEC THK2 15X12 ZIP CLS (MISCELLANEOUS) ×1
BAG ZIPLOCK 12X15 (MISCELLANEOUS) ×2 IMPLANT
BANDAGE ELASTIC 6 VELCRO ST LF (GAUZE/BANDAGES/DRESSINGS) ×2 IMPLANT
BANDAGE ESMARK 6X9 LF (GAUZE/BANDAGES/DRESSINGS) ×1 IMPLANT
BLADE SAG 18X100X1.27 (BLADE) ×2 IMPLANT
BLADE SAW SGTL 11.0X1.19X90.0M (BLADE) ×2 IMPLANT
BNDG CMPR 9X6 STRL LF SNTH (GAUZE/BANDAGES/DRESSINGS) ×1
BNDG ESMARK 6X9 LF (GAUZE/BANDAGES/DRESSINGS) ×2
BOWL SMART MIX CTS (DISPOSABLE) ×2 IMPLANT
CAPT RP KNEE ×1 IMPLANT
CEMENT HV SMART SET (Cement) ×4 IMPLANT
CLOTH BEACON ORANGE TIMEOUT ST (SAFETY) ×1 IMPLANT
CUFF TOURN SGL QUICK 34 (TOURNIQUET CUFF) ×2
CUFF TRNQT CYL 34X4X40X1 (TOURNIQUET CUFF) ×1 IMPLANT
DECANTER SPIKE VIAL GLASS SM (MISCELLANEOUS) ×2 IMPLANT
DRAPE EXTREMITY T 121X128X90 (DRAPE) ×2 IMPLANT
DRAPE POUCH INSTRU U-SHP 10X18 (DRAPES) ×2 IMPLANT
DRAPE U-SHAPE 47X51 STRL (DRAPES) ×2 IMPLANT
DRSG ADAPTIC 3X8 NADH LF (GAUZE/BANDAGES/DRESSINGS) ×2 IMPLANT
DRSG PAD ABDOMINAL 8X10 ST (GAUZE/BANDAGES/DRESSINGS) ×2 IMPLANT
DURAPREP 26ML APPLICATOR (WOUND CARE) ×2 IMPLANT
ELECT REM PT RETURN 9FT ADLT (ELECTROSURGICAL) ×2
ELECTRODE REM PT RTRN 9FT ADLT (ELECTROSURGICAL) ×1 IMPLANT
EVACUATOR 1/8 PVC DRAIN (DRAIN) ×2 IMPLANT
FACESHIELD LNG OPTICON STERILE (SAFETY) ×10 IMPLANT
GLOVE BIO SURGEON STRL SZ7.5 (GLOVE) IMPLANT
GLOVE BIO SURGEON STRL SZ8 (GLOVE) ×2 IMPLANT
GLOVE BIOGEL PI IND STRL 8 (GLOVE) ×2 IMPLANT
GLOVE BIOGEL PI INDICATOR 8 (GLOVE) ×2
GLOVE SURG SS PI 6.5 STRL IVOR (GLOVE) IMPLANT
GOWN PREVENTION PLUS LG XLONG (DISPOSABLE) ×2 IMPLANT
GOWN STRL REIN XL XLG (GOWN DISPOSABLE) IMPLANT
HANDPIECE INTERPULSE COAX TIP (DISPOSABLE) ×2
IMMOBILIZER KNEE 20 (SOFTGOODS) ×2
IMMOBILIZER KNEE 20 THIGH 36 (SOFTGOODS) ×1 IMPLANT
KIT BASIN OR (CUSTOM PROCEDURE TRAY) ×2 IMPLANT
MANIFOLD NEPTUNE II (INSTRUMENTS) ×2 IMPLANT
NDL SAFETY ECLIPSE 18X1.5 (NEEDLE) ×2 IMPLANT
NEEDLE HYPO 18GX1.5 SHARP (NEEDLE) ×4
NS IRRIG 1000ML POUR BTL (IV SOLUTION) ×2 IMPLANT
PACK TOTAL JOINT (CUSTOM PROCEDURE TRAY) ×2 IMPLANT
PADDING CAST COTTON 6X4 STRL (CAST SUPPLIES) ×4 IMPLANT
POSITIONER SURGICAL ARM (MISCELLANEOUS) ×2 IMPLANT
SET HNDPC FAN SPRY TIP SCT (DISPOSABLE) ×1 IMPLANT
SPONGE GAUZE 4X4 12PLY (GAUZE/BANDAGES/DRESSINGS) ×2 IMPLANT
STRIP CLOSURE SKIN 1/2X4 (GAUZE/BANDAGES/DRESSINGS) ×4 IMPLANT
SUCTION FRAZIER 12FR DISP (SUCTIONS) ×2 IMPLANT
SUT MNCRL AB 4-0 PS2 18 (SUTURE) ×2 IMPLANT
SUT VIC AB 2-0 CT1 27 (SUTURE) ×6
SUT VIC AB 2-0 CT1 TAPERPNT 27 (SUTURE) ×3 IMPLANT
SUT VLOC 180 0 24IN GS25 (SUTURE) ×2 IMPLANT
SYR 20CC LL (SYRINGE) ×2 IMPLANT
SYR 50ML LL SCALE MARK (SYRINGE) ×2 IMPLANT
TOWEL OR 17X26 10 PK STRL BLUE (TOWEL DISPOSABLE) ×4 IMPLANT
TRAY FOLEY CATH 14FRSI W/METER (CATHETERS) ×2 IMPLANT
WATER STERILE IRR 1500ML POUR (IV SOLUTION) ×2 IMPLANT
WRAP KNEE MAXI GEL POST OP (GAUZE/BANDAGES/DRESSINGS) ×2 IMPLANT
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
## 2013-03-04 NOTE — Op Note (Signed)
Pre-operative diagnosis- Osteoarthritis  Right knee(s)  Post-operative diagnosis- Osteoarthritis Right knee(s)  Procedure-  Right  Total Knee Arthroplasty  Surgeon- Gus Rankin. Emaree Chiu, MD  Assistant- Avel Peace, PA-C   Anesthesia-  Spinal  EBL-* No blood loss amount entered *   Drains Hemovac  Tourniquet time- 32 minutes @ 300 mm Hg  Complications- None  Condition-PACU - hemodynamically stable.   Brief Clinical Note  Dorothy Glenn is a 79 y.o. year old female with end stage OA of her right knee with progressively worsening pain and dysfunction. She has constant pain, with activity and at rest and significant functional deficits with difficulties even with ADLs. She has had extensive non-op management including analgesics, injections of cortisone and viscosupplements, and home exercise program, but remains in significant pain with significant dysfunction.Radiographs show bone on bone arthritis all 3 compartments. She presents now for right Total Knee Arthroplasty.    Procedure in detail---   The patient is brought into the operating room and positioned supine on the operating table. After successful administration of  Spinal,   a tourniquet is placed high on the  Right thigh(s) and the lower extremity is prepped and draped in the usual sterile fashion. Time out is performed by the operating team and then the  Right lower extremity is wrapped in Esmarch, knee flexed and the tourniquet inflated to 300 mmHg.       A midline incision is made with a ten blade through the subcutaneous tissue to the level of the extensor mechanism. A fresh blade is used to make a medial parapatellar arthrotomy. Soft tissue over the proximal medial tibia is subperiosteally elevated to the joint line with a knife and into the semimembranosus bursa with a Cobb elevator. Soft tissue over the proximal lateral tibia is elevated with attention being paid to avoiding the patellar tendon on the tibial tubercle. The patella  is everted, knee flexed 90 degrees and the ACL and PCL are removed. Findings are bone on bone all 3 compartments with large global osteophytes.        The drill is used to create a starting hole in the distal femur and the canal is thoroughly irrigated with sterile saline to remove the fatty contents. The 5 degree Right  valgus alignment guide is placed into the femoral canal and the distal femoral cutting block is pinned to remove 10 mm off the distal femur. Resection is made with an oscillating saw.      The tibia is subluxed forward and the menisci are removed. The extramedullary alignment guide is placed referencing proximally at the medial aspect of the tibial tubercle and distally along the second metatarsal axis and tibial crest. The block is pinned to remove 2mm off the more deficient medial  side. Resection is made with an oscillating saw. Size 3is the most appropriate size for the tibia and the proximal tibia is prepared with the modular drill and keel punch for that size.      The femoral sizing guide is placed and size 4 narrow is most appropriate. Rotation is marked off the epicondylar axis and confirmed by creating a rectangular flexion gap at 90 degrees. The size 4 cutting block is pinned in this rotation and the anterior, posterior and chamfer cuts are made with the oscillating saw. The intercondylar block is then placed and that cut is made.      Trial size 3 tibial component, trial size 4 narrow posterior stabilized femur and a 10  mm posterior  stabilized rotating platform insert trial is placed. Full extension is achieved with excellent varus/valgus and anterior/posterior balance throughout full range of motion. The patella is everted and thickness measured to be 22  mm. Free hand resection is taken to 12 mm, a 38 template is placed, lug holes are drilled, trial patella is placed, and it tracks normally. Osteophytes are removed off the posterior femur with the trial in place. All trials are  removed and the cut bone surfaces prepared with pulsatile lavage. Cement is mixed and once ready for implantation, the size 3 tibial implant, size  4 narrow posterior stabilized femoral component, and the size 38 patella are cemented in place and the patella is held with the clamp. The trial insert is placed and the knee held in full extension. The Exparel (20 ml mixed with 30 ml saline) and .25% Bupivicaine, are injected into the extensor mechanism, posterior capsule, medial and lateral gutters and subcutaneous tissues.  All extruded cement is removed and once the cement is hard the permanent 10 mm posterior stabilized rotating platform insert is placed into the tibial tray.      The wound is copiously irrigated with saline solution and the extensor mechanism closed over a hemovac drain with #1 PDS suture. The tourniquet is released for a total tourniquet time of 32  minutes. Flexion against gravity is 140 degrees and the patella tracks normally. Subcutaneous tissue is closed with 2.0 vicryl and subcuticular with running 4.0 Monocryl. The incision is cleaned and dried and steri-strips and a bulky sterile dressing are applied. The limb is placed into a knee immobilizer and the patient is awakened and transported to recovery in stable condition.      Please note that a surgical assistant was a medical necessity for this procedure in order to perform it in a safe and expeditious manner. Surgical assistant was necessary to retract the ligaments and vital neurovascular structures to prevent injury to them and also necessary for proper positioning of the limb to allow for anatomic placement of the prosthesis.   Gus Rankin Dorothy Stalder, MD    03/04/2013, 9:11 AM

## 2013-03-04 NOTE — Anesthesia Preprocedure Evaluation (Signed)
Anesthesia Evaluation  Patient identified by MRN, date of birth, ID band Patient awake    Reviewed: Allergy & Precautions, H&P , NPO status , Patient's Chart, lab work & pertinent test results  History of Anesthesia Complications (+) PONV and history of anesthetic complications  Airway       Dental  (+) Dental Advisory Given   Pulmonary neg pulmonary ROS,          Cardiovascular hypertension, Pt. on home beta blockers negative cardio ROS      Neuro/Psych negative neurological ROS  negative psych ROS   GI/Hepatic negative GI ROS, GERD-  Medicated,(+) Hepatitis -, A  Endo/Other  Hypothyroidism   Renal/GU negative Renal ROS     Musculoskeletal negative musculoskeletal ROS (+)   Abdominal   Peds  Hematology negative hematology ROS (+)   Anesthesia Other Findings   Reproductive/Obstetrics                           Anesthesia Physical Anesthesia Plan  ASA: III  Anesthesia Plan: Spinal   Post-op Pain Management:    Induction:   Airway Management Planned:   Additional Equipment:   Intra-op Plan:   Post-operative Plan:   Informed Consent: I have reviewed the patients History and Physical, chart, labs and discussed the procedure including the risks, benefits and alternatives for the proposed anesthesia with the patient or authorized representative who has indicated his/her understanding and acceptance.   Dental advisory given  Plan Discussed with: CRNA  Anesthesia Plan Comments:         Anesthesia Quick Evaluation

## 2013-03-04 NOTE — Anesthesia Procedure Notes (Signed)
Spinal  Patient location during procedure: OR Start time: 03/04/2013 8:12 AM End time: 03/04/2013 8:16 AM Staffing Anesthesiologist: Lewie Loron R Performed by: anesthesiologist  Preanesthetic Checklist Completed: patient identified, site marked, surgical consent, pre-op evaluation, timeout performed, IV checked, risks and benefits discussed and monitors and equipment checked Spinal Block Patient position: sitting Prep: ChloraPrep and site prepped and draped Patient monitoring: heart rate, continuous pulse ox and blood pressure Location: L2-3 Injection technique: single-shot Needle Needle type: Quincke  Needle gauge: 22 G Needle length: 9 cm Assessment Sensory level: T8 Additional Notes Expiration date of kit checked and confirmed. Patient tolerated procedure well, without complications.

## 2013-03-04 NOTE — Preoperative (Signed)
Beta Blockers   Reason not to administer Beta Blockers:Not Applicable 

## 2013-03-04 NOTE — Interval H&P Note (Signed)
History and Physical Interval Note:  03/04/2013 6:54 AM  Dorothy Glenn  has presented today for surgery, with the diagnosis of OA OF RIGHT KNEE  The various methods of treatment have been discussed with the patient and family. After consideration of risks, benefits and other options for treatment, the patient has consented to  Procedure(s): RIGHT TOTAL KNEE ARTHROPLASTY (Right) as a surgical intervention .  The patient's history has been reviewed, patient examined, no change in status, stable for surgery.  I have reviewed the patient's chart and labs.  Questions were answered to the patient's satisfaction.     Loanne Drilling

## 2013-03-04 NOTE — Progress Notes (Signed)
Utilization review completed.  

## 2013-03-04 NOTE — Transfer of Care (Signed)
Immediate Anesthesia Transfer of Care Note  Patient: Dorothy Glenn  Procedure(s) Performed: Procedure(s): RIGHT TOTAL KNEE ARTHROPLASTY (Right)  Patient Location: PACU  Anesthesia Type:Spinal  Level of Consciousness: awake and alert   Airway & Oxygen Therapy: Patient Spontanous Breathing and Patient connected to face mask oxygen  Post-op Assessment: Report given to PACU RN and Post -op Vital signs reviewed and stable  Post vital signs: Reviewed and stable  Complications: No apparent anesthesia complications

## 2013-03-04 NOTE — Anesthesia Postprocedure Evaluation (Signed)
Anesthesia Post Note  Patient: Dorothy Glenn  Procedure(s) Performed: Procedure(s) (LRB): RIGHT TOTAL KNEE ARTHROPLASTY (Right)  Anesthesia type: Spinal  Patient location: PACU  Post pain: Pain level controlled  Post assessment: Post-op Vital signs reviewed  Last Vitals: BP 141/84  Pulse 68  Temp(Src) 36.3 C (Oral)  Resp 14  SpO2 99%  Post vital signs: Reviewed  Level of consciousness: sedated  Complications: No apparent anesthesia complications

## 2013-03-04 NOTE — Evaluation (Signed)
Physical Therapy Evaluation Patient Details Name: Dorothy Glenn MRN: [id number] DOB: [birth year] Today's Date: 03/04/2013 Time: 4098-1191 PT Time Calculation (min): 29 min  PT Assessment / Plan / Recommendation History of Present Illness  79 yo female s/p R TKA 10/13-POD 0. Hx of cancer, partial blindness R eye, chronic pain, Hep A, diverticulitis.   Clinical Impression  On eval POD 0, pt required Min-Mod assist for mobility-able to stand and take a few side steps at EOB. Recommend HHPT and 24 hour supervision initially.     PT Assessment  Patient needs continued PT services    Follow Up Recommendations  Home health PT;Supervision/Assistance - 24 hour    Does the patient have the potential to tolerate intense rehabilitation      Barriers to Discharge        Equipment Recommendations  None recommended by PT    Recommendations for Other Services OT consult   Frequency 7X/week    Precautions / Restrictions Precautions Precautions: Fall;Knee Required Braces or Orthoses: Knee Immobilizer - Right Knee Immobilizer - Right: Discontinue once straight leg raise with < 10 degree lag Restrictions Weight Bearing Restrictions: No RLE Weight Bearing: Weight bearing as tolerated   Pertinent Vitals/Pain R knee 7/10. Ice applied end of session.       Mobility  Bed Mobility Bed Mobility: Supine to Sit;Sit to Supine Supine to Sit: 4: Min assist;HOB elevated;With rails Sit to Supine: 4: Min assist;HOB elevated;With rail Details for Bed Mobility Assistance: Assist for R LE off bed. VCS safety, technique, hand placement.  Transfers Transfers: Sit to Stand;Stand to Sit Sit to Stand: 4: Min assist;From bed;From elevated surface Stand to Sit: 4: Min assist;To bed;To elevated surface Details for Transfer Assistance: Assist to rise, stabilize, control descent. VCS safety, technique, hand placment. Pt somewhat impulsive.  Ambulation/Gait Ambulation/Gait Assistance: 3: Mod assist Assistive  device: Rolling walker Ambulation/Gait Assistance Details: 2-3 side step to [facility]. Pt somewhat weak, unsteady. Deferred further ambulation at this time.     Exercises     PT Diagnosis: Difficulty walking;Abnormality of gait;Generalized weakness;Acute pain  PT Problem List: Decreased strength;Decreased range of motion;Decreased activity tolerance;Decreased mobility;Pain;Decreased knowledge of precautions;Decreased knowledge of use of DME PT Treatment Interventions: DME instruction;Gait training;Stair training;Functional mobility training;Therapeutic activities;Therapeutic exercise;Patient/family education     PT Goals(Current goals can be found in the care plan section) Acute Rehab PT Goals Patient Stated Goal: home. regain independence PT Goal Formulation: With patient/family Time For Goal Achievement: 03/11/13 Potential to Achieve Goals: Good  Visit Information  Last PT Received On: 03/04/13 Assistance Needed: +1 (+1.5) History of Present Illness: 79 yo female s/p R TKA 10/13-POD 0. Hx of cancer, partial blindness R eye, chronic pain, Hep A, diverticulitis.        Prior Functioning  Home Living Family/patient expects to be discharged to:: Private residence Living Arrangements: Alone Available Help at Discharge: Family Type of Home: House Home Access: Stairs to enter [employer] of Steps: 2 Entrance Stairs-Rails: Right;Left Home Layout: One level Home Equipment: Environmental consultant - 2 wheels;Bedside commode;Cane - single point Prior Function Level of Independence: Independent Communication Communication: No difficulties    Cognition  Cognition Arousal/Alertness: Awake/alert Behavior During Therapy: WFL for tasks assessed/performed Overall Cognitive Status: Within Functional Limits for tasks assessed    Extremity/Trunk Assessment Upper Extremity Assessment Upper Extremity Assessment: Generalized weakness Lower Extremity Assessment Lower Extremity Assessment: Generalized  weakness;RLE deficits/detail RLE Deficits / Details: hip flex 2/5, hip abd/add 2/5, moves ankle well Cervical / Trunk Assessment Cervical /  Trunk Assessment: Normal   Balance    End of Session PT - End of Session Equipment Utilized During Treatment: Gait belt Activity Tolerance: Patient limited by fatigue;Patient limited by pain Patient left: in bed;with call bell/phone within reach;with family/visitor present CPM Right Knee CPM Right Knee: Off  GP     Rebeca Alert, MPT Pager: [phone]

## 2013-03-05 ENCOUNTER — Encounter (HOSPITAL_COMMUNITY): Payer: Self-pay | Admitting: Orthopedic Surgery

## 2013-03-05 LAB — CBC
HCT: 29.7 % — ABNORMAL LOW (ref 36.0–46.0)
Hemoglobin: 9.9 g/dL — ABNORMAL LOW (ref 12.0–15.0)
MCV: 88.4 fL (ref 78.0–100.0)
RBC: 3.36 MIL/uL — ABNORMAL LOW (ref 3.87–5.11)
RDW: 13.4 % (ref 11.5–15.5)
WBC: 12.5 10*3/uL — ABNORMAL HIGH (ref 4.0–10.5)

## 2013-03-05 LAB — BASIC METABOLIC PANEL
BUN: 10 mg/dL (ref 6–23)
CO2: 26 mEq/L (ref 19–32)
Chloride: 104 mEq/L (ref 96–112)
Creatinine, Ser: 0.79 mg/dL (ref 0.50–1.10)
GFR calc Af Amer: 87 mL/min — ABNORMAL LOW (ref >90)
Glucose, Bld: 166 mg/dL — ABNORMAL HIGH (ref 70–99)
Potassium: 4.1 mEq/L (ref 3.5–5.1)

## 2013-03-05 NOTE — Care Management Note (Addendum)
    Page 1 of 1   03/06/2013     11:55:32 AM   CARE MANAGEMENT NOTE 03/06/2013  Patient:  Dorothy Glenn, Dorothy Glenn   Account Number:  [id number]  Date Initiated:  03/05/2013  Documentation initiated by:  Colleen Can  Subjective/Objective Assessment:   DX TOTAL RIGHT KNEE REPLACEMNT     Action/Plan:   CM spoke with patient & family. Plans are for her to return to her daughter' s home in [place] where son & daughter-in-law will be caregivers. Pt already has BSC, cane, crutches, w/c , RW. Requesting [facility] for [facility] services.   Anticipated DC Date:  03/06/2013   Anticipated DC Plan:  HOME W HOME HEALTH SERVICES      DC Planning Services  CM consult      [facility] Choice  HOME HEALTH   Choice offered to / List presented to:  C-1 Patient        HH arranged  HH-2 PT      [facility] agency  [facility]   Status of service:  Completed, signed off Medicare Important Message given?  NA - LOS <3 / Initial given by admissions (If response is "NO", the following Medicare IM given date fields will be blank) Date Medicare IM given:   Date Additional Medicare IM given:    Discharge Disposition:  HOME W HOME HEALTH SERVICES  Per UR Regulation:    If discussed at Long Length of Stay Meetings, dates discussed:    Comments:  03/05/2013 Colleen Can BSN RN CCM [phone] TCT [facility] rep requesting services. They will be able to provide [facility] services with start of day after discharge.

## 2013-03-05 NOTE — Plan of Care (Signed)
Problem: Consults Goal: Diagnosis- Total Joint Replacement Primary Total Knee     
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
## 2013-03-05 NOTE — Progress Notes (Signed)
Physical Therapy Treatment Patient Details Name: Dorothy Glenn MRN: [id number] DOB: [birth year] Today's Date: 03/05/2013 Time: 4098-1191 PT Time Calculation (min): 23 min  PT Assessment / Plan / Recommendation  History of Present Illness 79 yo female s/p R TKA 10/13. Hx of cancer, partial blindness R eye, chronic pain, Hep A, diverticulitis.    PT Comments   POD # 1 pm session.  Assisted pt OOB to amb in [place] a second time with increased distance then assisted back to bed for CPM.  Pt progressing well.   Follow Up Recommendations  Home health PT;Supervision/Assistance - 24 hour     Does the patient have the potential to tolerate intense rehabilitation     Barriers to Discharge        Equipment Recommendations  None recommended by PT    Recommendations for Other Services    Frequency 7X/week   Progress towards PT Goals Progress towards PT goals: Progressing toward goals  Plan      Precautions / Restrictions Precautions Precautions: Fall;Knee Precaution Comments: instructed pt on KI use for amb Required Braces or Orthoses: Knee Immobilizer - Right Knee Immobilizer - Right: Discontinue once straight leg raise with < 10 degree lag Restrictions Weight Bearing Restrictions: No RLE Weight Bearing: Weight bearing as tolerated    Pertinent Vitals/Pain C/o 5/10 ICE applied    Mobility  Bed Mobility Bed Mobility: Supine to Sit;Sit to Supine Supine to Sit: 4: Min assist;HOB elevated Sit to Supine: 4: Min assist Details for Bed Mobility Assistance: Min assist to support R LE on/off bed plus increased time to scoot Transfers Transfers: Sit to Stand;Stand to Sit Sit to Stand: 4: Min assist;From elevated surface;From chair/3-in-1;3: Mod assist;From bed Stand to Sit: 4: Min assist;To bed Details for Transfer Assistance: 50% VC's on proper tech and hand placement Ambulation/Gait Ambulation/Gait Assistance: 4: Min assist;3: Mod assist Ambulation Distance (Feet): 58 Feet Assistive  device: Rolling walker Ambulation/Gait Assistance Details: Amb increased distance this afternoon.  Pt required increased time and 25% VC's on proper sequencing and upright posture.   Gait Pattern: Step-to pattern;Decreased stance time - right Gait velocity: decreased     PT Goals (current goals can now be found in the care plan section)    Visit Information  Last PT Received On: 03/05/13 Assistance Needed: +1 History of Present Illness: 79 yo female s/p R TKA 10/13. Hx of cancer, partial blindness R eye, chronic pain, Hep A, diverticulitis.     Subjective Data      Cognition       Balance     End of Session PT - End of Session Equipment Utilized During Treatment: Gait belt;Right knee immobilizer Activity Tolerance: Patient limited by fatigue Patient left: in bed;with call bell/phone within reach;with family/visitor present   Felecia Shelling  PTA [facility]  Acute  Rehab Pager      [phone]

## 2013-03-05 NOTE — Evaluation (Signed)
Occupational Therapy Evaluation Patient Details Name: Dorothy Glenn MRN: [id number] DOB: 1929/05/23 Today's Date: 03/05/2013 Time: 4132-4401 OT Time Calculation (min): 25 min  OT Assessment / Plan / Recommendation History of present illness 79 yo female s/p R TKA 10/13-POD 0. Hx of cancer, partial blindness R eye, chronic pain, Hep A, diverticulitis.    Clinical Impression   Pt was admitted for R TKA.  Will follow in acute to continue education, but she will not need follow up OT after this.       OT Assessment  Patient needs continued OT Services    Follow Up Recommendations  No OT follow up    Barriers to Discharge      Equipment Recommendations  None recommended by OT    Recommendations for Other Services    Frequency  Min 2X/week    Precautions / Restrictions Precautions Precautions: Fall;Knee Required Braces or Orthoses: Knee Immobilizer - Right Knee Immobilizer - Right: Discontinue once straight leg raise with < 10 degree lag Restrictions RLE Weight Bearing: Weight bearing as tolerated   Pertinent Vitals/Pain 8/10 RLE with weight bearing; repositioned and ice applied.  Premedicated    ADL  Lower Body Bathing: Minimal assistance Where Assessed - Lower Body Bathing: Supported sit to stand Lower Body Dressing: Moderate assistance Where Assessed - Lower Body Dressing: Supported sit to Pharmacist, hospital: Minimal assistance Statistician Method: Surveyor, minerals:  (bed to chair) Toileting - Clothing Manipulation and Hygiene: Minimal assistance Where Assessed - Engineer, mining and Hygiene: Sit to stand from 3-in-1 or toilet Equipment Used: Rolling walker Transfers/Ambulation Related to ADLs: spt only to 3:1.  Still has catheter ADL Comments: set up for UB adls.  Will have 24/7 help.  Pt states other knee is nearly as bad as R.      OT Diagnosis: Generalized weakness  OT Problem List: Decreased strength;Decreased activity  tolerance;Pain;Decreased knowledge of use of DME or AE OT Treatment Interventions: Self-care/ADL training;DME and/or AE instruction;Patient/family education   OT Goals(Current goals can be found in the care plan section) Acute Rehab OT Goals Patient Stated Goal: regain independence OT Goal Formulation: With patient Time For Goal Achievement: 03/12/13 Potential to Achieve Goals: Good ADL Goals Pt Will Transfer to Toilet: with min guard assist;ambulating;bedside commode Pt Will Perform Toileting - Clothing Manipulation and hygiene: with min guard assist;sit to/from stand Pt Will Perform Tub/Shower Transfer: with min assist;Shower transfer;ambulating;shower seat  Visit Information  Last OT Received On: 03/05/13 Assistance Needed: +1 History of Present Illness: 79 yo female s/p R TKA 10/13-POD 0. Hx of cancer, partial blindness R eye, chronic pain, Hep A, diverticulitis.        Prior Functioning     Home Living Family/patient expects to be discharged to:: Private residence Living Arrangements: Alone Available Help at Discharge: Family Home Equipment: Shower seat;Bedside commode Additional Comments: will go to son's house initially Prior Function Level of Independence: Independent Communication Communication: No difficulties         Vision/Perception     Copywriter, advertising Behavior During Therapy: WFL for tasks assessed/performed Overall Cognitive Status: Within Functional Limits for tasks assessed    Extremity/Trunk Assessment Upper Extremity Assessment Upper Extremity Assessment: Generalized weakness     Mobility Bed Mobility Supine to Sit: 4: Min assist;HOB elevated Details for Bed Mobility Assistance: assist for RLE, vcs for technique Transfers Sit to Stand: 4: Min assist;From bed;From elevated surface Stand to Sit: 4: Min assist;To chair/3-in-1 Details for Transfer Assistance: assist  to rise and steady.  Cues for hand and LE placement     Exercise      Balance     End of Session OT - End of Session Activity Tolerance: Patient tolerated treatment well Patient left: in chair;with call bell/phone within reach  GO     [facility] 03/05/2013, 9:36 AM Marica Otter, OTR/L [phone] 03/05/2013

## 2013-03-05 NOTE — Progress Notes (Signed)
Physical Therapy Treatment Patient Details Name: Dorothy Glenn MRN: [id number] DOB: October 26, 1928 Today's Date: 03/05/2013 Time: 6213-0865 PT Time Calculation (min): 26 min  PT Assessment / Plan / Recommendation  History of Present Illness 79 yo female s/p R TKA 10/13. Hx of cancer, partial blindness R eye, chronic pain, Hep A, diverticulitis.    PT Comments   POD # 1 am session.  Amb pt in hallway then assisted back to bed to perform TE's.  Pt requested to go back to bed due to poor sleep last night.   Follow Up Recommendations  Home health PT;Supervision/Assistance - 24 hour     Does the patient have the potential to tolerate intense rehabilitation     Barriers to Discharge        Equipment Recommendations       Recommendations for Other Services    Frequency 7X/week   Progress towards PT Goals Progress towards PT goals: Progressing toward goals  Plan      Precautions / Restrictions Precautions Precautions: Fall;Knee Precaution Comments: instructed pt on KI use for amb Required Braces or Orthoses: Knee Immobilizer - Right Knee Immobilizer - Right: Discontinue once straight leg raise with < 10 degree lag Restrictions Weight Bearing Restrictions: No RLE Weight Bearing: Weight bearing as tolerated   Pertinent Vitals/Pain  C/o 7/10 pain with act ICE applied    Mobility  Bed Mobility Bed Mobility: Sit to Supine Supine to Sit: 4: Min assist;HOB elevated Sit to Supine: 3: Mod assist Details for Bed Mobility Assistance: Mod assist to support R LE up onto bed.  Transfers Transfers: Sit to Stand;Stand to Sit Sit to Stand: 4: Min assist;From elevated surface;From chair/3-in-1;3: Mod assist Stand to Sit: 4: Min assist;To bed Details for Transfer Assistance: 50% VC's on proper tech and hand placement Ambulation/Gait Ambulation/Gait Assistance: 4: Min assist;3: Mod assist Ambulation Distance (Feet): 25 Feet Assistive device: Rolling walker Ambulation/Gait Assistance Details:  Amb from recliner that I parked in the hallway to the bed with increased time and 25% VC's on proper sequencing Gait Pattern: Step-to pattern;Decreased stance time - right Gait velocity: decreased    Exercises   Total Knee Replacement TE's 10 reps B LE ankle pumps 10 reps knee presses 10 reps heel slides  10 reps SAQ's 10 reps SLR's 10 reps ABD Followed by ICE     PT Goals (current goals can now be found in the care plan section) Acute Rehab PT Goals Patient Stated Goal: regain independence  Visit Information  Last PT Received On: 03/05/13 Assistance Needed: +1 History of Present Illness: 79 yo female s/p R TKA 10/13. Hx of cancer, partial blindness R eye, chronic pain, Hep A, diverticulitis.     Subjective Data  Patient Stated Goal: regain independence   Cognition  Cognition Behavior During Therapy: WFL for tasks assessed/performed Overall Cognitive Status: Within Functional Limits for tasks assessed    Balance     End of Session PT - End of Session Equipment Utilized During Treatment: Gait belt Activity Tolerance: Patient limited by fatigue Patient left: in bed;with call bell/phone within reach;with family/visitor present   Felecia Shelling  PTA [facility]  Acute  Rehab Pager      [phone]

## 2013-03-06 LAB — CBC
HCT: 30.9 % — ABNORMAL LOW (ref 36.0–46.0)
Hemoglobin: 9.7 g/dL — ABNORMAL LOW (ref 12.0–15.0)
MCH: 28.4 pg (ref 26.0–34.0)
MCHC: 31.4 g/dL (ref 30.0–36.0)
MCV: 90.4 fL (ref 78.0–100.0)
RDW: 13.9 % (ref 11.5–15.5)

## 2013-03-06 LAB — BASIC METABOLIC PANEL
BUN: 13 mg/dL (ref 6–23)
Creatinine, Ser: 0.82 mg/dL (ref 0.50–1.10)
GFR calc Af Amer: 75 mL/min — ABNORMAL LOW (ref >90)
GFR calc non Af Amer: 65 mL/min — ABNORMAL LOW (ref >90)
Glucose, Bld: 141 mg/dL — ABNORMAL HIGH (ref 70–99)
Potassium: 4.9 mEq/L (ref 3.5–5.1)
Sodium: 140 mEq/L (ref 135–145)

## 2013-03-06 MED ORDER — OXYCODONE HCL 5 MG PO TABS: 5.0000 mg | ORAL_TABLET | ORAL | Status: DC | PRN

## 2013-03-06 MED ORDER — RIVAROXABAN 10 MG PO TABS: 10.0000 mg | ORAL_TABLET | Freq: Every day | ORAL | Status: DC

## 2013-03-06 MED ORDER — METHOCARBAMOL 500 MG PO TABS: 500.0000 mg | ORAL_TABLET | Freq: Four times a day (QID) | ORAL | Status: DC | PRN

## 2013-03-06 NOTE — Plan of Care (Signed)
Problem: Consults Goal: Diagnosis- Total Joint Replacement Outcome: Completed/Met Date Met:  03/06/13 Primary Total Knee RIGHT
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
## 2013-03-06 NOTE — Progress Notes (Signed)
Physical Therapy Treatment Patient Details Name: DEMITA TOBIA MRN: [id number] DOB: 08/10/1928 Today's Date: 03/06/2013 Time: 9562-1308 PT Time Calculation (min): 21 min  PT Assessment / Plan / Recommendation  History of Present Illness 79 yo female s/p R TKA 10/13. Hx of cancer, partial blindness R eye, chronic pain, Hep A, diverticulitis.    PT Comments   Family feels confident on steps. ready for Dc.  Follow Up Recommendations  Home health PT;Supervision/Assistance - 24 hour     Does the patient have the potential to tolerate intense rehabilitation     Barriers to Discharge        Equipment Recommendations  None recommended by PT    Recommendations for Other Services    Frequency 7X/week   Progress towards PT Goals Progress towards PT goals: Progressing toward goals  Plan Current plan remains appropriate    Precautions / Restrictions Precautions Precautions: Fall;Knee Precaution Comments: instructed pt on KI use for amb Required Braces or Orthoses: Knee Immobilizer - Right Knee Immobilizer - Right: Discontinue once straight leg raise with < 10 degree lag   Pertinent Vitals/Pain     Mobility  Bed Mobility Supine to Sit: 4: Min assist;HOB elevated Details for Bed Mobility Assistance: Min assist to support R LE on/off bed plus increased time to scoot Transfers Sit to Stand: 4: Min guard;From bed;From elevated surface;With upper extremity assist Stand to Sit: 4: Min guard;To chair/3-in-1 Details for Transfer Assistance: cues for hand and leg placement Ambulation/Gait Ambulation/Gait Assistance: 4: Min assist;3: Mod assist Ambulation Distance (Feet): 50 Feet Assistive device: Rolling walker Ambulation/Gait Assistance Details: increased tolerance to ambulationm, cues for safe use RW. Family reports that pt has a 4 wheeled RW and can use a SW.  Gait Pattern: Step-to pattern;Decreased stance time - right Stairs: Yes Stairs Assistance: 3: Mod assist Stairs Assistance  Details (indicate cue type and reason): son present to assist. Stair Management Technique: One rail Left;Alternating pattern;Forwards;With cane Number of Stairs: 2    Exercises Total Joint Exercises Ankle Circles/Pumps: AROM;Both;10 reps Short Arc QuadBarbaraann Boys;Right;10 reps Heel Slides: AAROM;Right;10 reps Hip ABduction/ADduction: AAROM;Right;10 reps Straight Leg Raises: AAROM;Right;10 reps Goniometric ROM: 10-40 R knee   PT Diagnosis:    PT Problem List:   PT Treatment Interventions:     PT Goals (current goals can now be found in the care plan section)    Visit Information  Last PT Received On: 03/06/13 Assistance Needed: +2 History of Present Illness: 79 yo female s/p R TKA 10/13. Hx of cancer, partial blindness R eye, chronic pain, Hep A, diverticulitis.     Subjective Data      Cognition  Cognition Arousal/Alertness: Awake/alert    Balance     End of Session PT - End of Session Equipment Utilized During Treatment: Gait belt Activity Tolerance: Patient tolerated treatment well Patient left: with call bell/phone within reach;with family/visitor present Nurse Communication: Mobility status   GP     Rada Hay 03/06/2013, 3:06 PM
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
## 2013-03-06 NOTE — Progress Notes (Signed)
Physical Therapy Treatment Patient Details Name: Dorothy Glenn MRN: [id number] DOB: [birth year] Today's Date: 03/06/2013 Time: 4098-1191 PT Time Calculation (min): 36 min  PT Assessment / Plan / Recommendation  History of Present Illness 79 yo female s/p R TKA 10/13. Hx of cancer, partial blindness R eye, chronic pain, Hep A, diverticulitis.    PT Comments   Pt's son/caregiver present for instruction in exercises. Will practice steps after meds kick in.  Follow Up Recommendations  Home health PT;Supervision/Assistance - 24 hour     Does the patient have the potential to tolerate intense rehabilitation     Barriers to Discharge        Equipment Recommendations  None recommended by PT    Recommendations for Other Services    Frequency 7X/week   Progress towards PT Goals Progress towards PT goals: Progressing toward goals  Plan Current plan remains appropriate    Precautions / Restrictions Precautions Precautions: Fall;Knee Precaution Comments: instructed pt on KI use for amb Required Braces or Orthoses: Knee Immobilizer - Right Knee Immobilizer - Right: Discontinue once straight leg raise with < 10 degree lag   Pertinent Vitals/Pain Knee flexing =5    Mobility       Exercises Total Joint Exercises Ankle Circles/Pumps: AROM;Both;10 reps Short Arc QuadBarbaraann Boys;Right;10 reps Heel Slides: AAROM;Right;10 reps Hip ABduction/ADduction: AAROM;Right;10 reps Straight Leg Raises: AAROM;Right;10 reps Goniometric ROM: 10-40 R knee   PT Diagnosis:    PT Problem List:   PT Treatment Interventions:     PT Goals (current goals can now be found in the care plan section)    Visit Information  Last PT Received On: 03/06/13 Assistance Needed: +2 History of Present Illness: 79 yo female s/p R TKA 10/13. Hx of cancer, partial blindness R eye, chronic pain, Hep A, diverticulitis.     Subjective Data      Cognition  Cognition Arousal/Alertness: Awake/alert    Balance     End of  Session PT - End of Session Activity Tolerance: Patient tolerated treatment well Patient left: in bed;with call bell/phone within reach;with family/visitor present Nurse Communication: Mobility status, pain meds.   GP     Rada Hay 03/06/2013, 3:03 PM

## 2013-03-06 NOTE — Progress Notes (Signed)
Occupational Therapy Treatment Patient Details Name: ANNIAH GLICK MRN: [id number] DOB: 20-Feb-1929 Today's Date: 03/06/2013 Time: 7829-5621 OT Time Calculation (min): 16 min  OT Assessment / Plan / Recommendation  History of present illness 79 yo female s/p R TKA 10/13. Hx of cancer, partial blindness R eye, chronic pain, Hep A, diverticulitis.    OT comments  Tolerated session well.  Has increased pain with weight bearing.  Follow Up Recommendations  No OT follow up    Barriers to Discharge       Equipment Recommendations  None recommended by OT    Recommendations for Other Services    Frequency     Progress towards OT Goals Progress towards OT goals: Progressing toward goals  Plan      Precautions / Restrictions Precautions Precautions: Fall;Knee Required Braces or Orthoses: Knee Immobilizer - Right Restrictions RLE Weight Bearing: Weight bearing as tolerated   Pertinent Vitals/Pain Initially had a little pain in bed; pain increased to high with weight bearing.  Repositioned in bed with ice and pain was decreasing at end of session    ADL  Toilet Transfer: Simulated;Min guard (from bed, back to bed, elevated) Tub/Shower Transfer: Performed;Minimal assistance Tub/Shower Transfer Method: Ambulating Tub/Shower Transfer Equipment: Walk in shower Transfers/Ambulation Related to ADLs: ambulated to bathroom with min guard. Min A to steady when stepping into shower stall.  Pt had no toileting needs.  Has assist for adls.      OT Diagnosis:    OT Problem List:   OT Treatment Interventions:     OT Goals(current goals can now be found in the care plan section)    Visit Information  Last OT Received On: 03/06/13 Assistance Needed: +1 History of Present Illness: 79 yo female s/p R TKA 10/13. Hx of cancer, partial blindness R eye, chronic pain, Hep A, diverticulitis.     Subjective Data      Prior Functioning       Cognition  Cognition Behavior During Therapy: WFL for  tasks assessed/performed Overall Cognitive Status: Within Functional Limits for tasks assessed    Mobility  Bed Mobility Supine to Sit: 4: Min assist;HOB elevated Details for Bed Mobility Assistance: Min assist to support R LE on/off bed plus increased time to scoot Transfers Sit to Stand: 4: Min guard;From bed;From elevated surface;With upper extremity assist Stand to Sit: 4: Min guard Details for Transfer Assistance: cues for hand and leg placement    Exercises      Balance     End of Session OT - End of Session Activity Tolerance: Patient tolerated treatment well Patient left: in bed;with call bell/phone within reach  GO     [facility] 03/06/2013, 9:37 AM Marica Otter, OTR/L [phone] 03/06/2013

## 2013-03-06 NOTE — Progress Notes (Signed)
Discharged from floor via w/c, family with pt. No changes in assessment. Dorothy Glenn  

## 2013-03-07 DIAGNOSIS — Z96659 Presence of unspecified artificial knee joint: Secondary | ICD-10-CM | POA: Diagnosis not present

## 2013-03-07 DIAGNOSIS — Z471 Aftercare following joint replacement surgery: Secondary | ICD-10-CM | POA: Diagnosis not present

## 2013-03-07 DIAGNOSIS — IMO0001 Reserved for inherently not codable concepts without codable children: Secondary | ICD-10-CM | POA: Diagnosis not present

## 2013-03-08 DIAGNOSIS — Z471 Aftercare following joint replacement surgery: Secondary | ICD-10-CM | POA: Diagnosis not present

## 2013-03-08 DIAGNOSIS — Z96659 Presence of unspecified artificial knee joint: Secondary | ICD-10-CM | POA: Diagnosis not present

## 2013-03-08 DIAGNOSIS — IMO0001 Reserved for inherently not codable concepts without codable children: Secondary | ICD-10-CM | POA: Diagnosis not present

## 2013-03-11 DIAGNOSIS — IMO0001 Reserved for inherently not codable concepts without codable children: Secondary | ICD-10-CM | POA: Diagnosis not present

## 2013-03-11 DIAGNOSIS — Z471 Aftercare following joint replacement surgery: Secondary | ICD-10-CM | POA: Diagnosis not present

## 2013-03-11 DIAGNOSIS — Z96659 Presence of unspecified artificial knee joint: Secondary | ICD-10-CM | POA: Diagnosis not present

## 2013-03-12 DIAGNOSIS — Z96659 Presence of unspecified artificial knee joint: Secondary | ICD-10-CM | POA: Diagnosis not present

## 2013-03-12 DIAGNOSIS — IMO0001 Reserved for inherently not codable concepts without codable children: Secondary | ICD-10-CM | POA: Diagnosis not present

## 2013-03-12 DIAGNOSIS — Z471 Aftercare following joint replacement surgery: Secondary | ICD-10-CM | POA: Diagnosis not present

## 2013-03-14 DIAGNOSIS — IMO0001 Reserved for inherently not codable concepts without codable children: Secondary | ICD-10-CM | POA: Diagnosis not present

## 2013-03-14 DIAGNOSIS — Z471 Aftercare following joint replacement surgery: Secondary | ICD-10-CM | POA: Diagnosis not present

## 2013-03-14 DIAGNOSIS — Z96659 Presence of unspecified artificial knee joint: Secondary | ICD-10-CM | POA: Diagnosis not present

## 2013-03-15 DIAGNOSIS — Z96659 Presence of unspecified artificial knee joint: Secondary | ICD-10-CM | POA: Diagnosis not present

## 2013-03-15 DIAGNOSIS — IMO0001 Reserved for inherently not codable concepts without codable children: Secondary | ICD-10-CM | POA: Diagnosis not present

## 2013-03-15 DIAGNOSIS — Z471 Aftercare following joint replacement surgery: Secondary | ICD-10-CM | POA: Diagnosis not present

## 2013-03-18 DIAGNOSIS — IMO0001 Reserved for inherently not codable concepts without codable children: Secondary | ICD-10-CM | POA: Diagnosis not present

## 2013-03-18 DIAGNOSIS — Z96659 Presence of unspecified artificial knee joint: Secondary | ICD-10-CM | POA: Diagnosis not present

## 2013-03-18 DIAGNOSIS — Z471 Aftercare following joint replacement surgery: Secondary | ICD-10-CM | POA: Diagnosis not present

## 2013-03-20 DIAGNOSIS — Z96659 Presence of unspecified artificial knee joint: Secondary | ICD-10-CM | POA: Diagnosis not present

## 2013-03-20 DIAGNOSIS — Z471 Aftercare following joint replacement surgery: Secondary | ICD-10-CM | POA: Diagnosis not present

## 2013-03-20 DIAGNOSIS — IMO0001 Reserved for inherently not codable concepts without codable children: Secondary | ICD-10-CM | POA: Diagnosis not present

## 2013-03-22 DIAGNOSIS — Z96659 Presence of unspecified artificial knee joint: Secondary | ICD-10-CM | POA: Diagnosis not present

## 2013-03-22 DIAGNOSIS — Z471 Aftercare following joint replacement surgery: Secondary | ICD-10-CM | POA: Diagnosis not present

## 2013-03-22 DIAGNOSIS — IMO0001 Reserved for inherently not codable concepts without codable children: Secondary | ICD-10-CM | POA: Diagnosis not present

## 2013-03-25 DIAGNOSIS — R262 Difficulty in walking, not elsewhere classified: Secondary | ICD-10-CM | POA: Diagnosis not present

## 2013-03-25 DIAGNOSIS — M6281 Muscle weakness (generalized): Secondary | ICD-10-CM | POA: Diagnosis not present

## 2013-03-25 DIAGNOSIS — M25569 Pain in unspecified knee: Secondary | ICD-10-CM | POA: Diagnosis not present

## 2013-04-08 DIAGNOSIS — R262 Difficulty in walking, not elsewhere classified: Secondary | ICD-10-CM | POA: Diagnosis not present

## 2013-04-08 DIAGNOSIS — M25569 Pain in unspecified knee: Secondary | ICD-10-CM | POA: Diagnosis not present

## 2013-04-08 DIAGNOSIS — M6281 Muscle weakness (generalized): Secondary | ICD-10-CM | POA: Diagnosis not present

## 2013-05-01 DIAGNOSIS — B351 Tinea unguium: Secondary | ICD-10-CM | POA: Diagnosis not present

## 2013-05-01 DIAGNOSIS — L851 Acquired keratosis [keratoderma] palmaris et plantaris: Secondary | ICD-10-CM | POA: Diagnosis not present

## 2013-05-01 DIAGNOSIS — I70209 Unspecified atherosclerosis of native arteries of extremities, unspecified extremity: Secondary | ICD-10-CM | POA: Diagnosis not present

## 2013-05-14 DIAGNOSIS — Z96659 Presence of unspecified artificial knee joint: Secondary | ICD-10-CM | POA: Diagnosis not present

## 2013-05-14 DIAGNOSIS — M1710 Unilateral primary osteoarthritis, unspecified knee: Secondary | ICD-10-CM | POA: Diagnosis not present

## 2013-05-28 DIAGNOSIS — E039 Hypothyroidism, unspecified: Secondary | ICD-10-CM | POA: Diagnosis not present

## 2013-07-23 DIAGNOSIS — Z96659 Presence of unspecified artificial knee joint: Secondary | ICD-10-CM | POA: Diagnosis not present

## 2013-07-23 DIAGNOSIS — M1710 Unilateral primary osteoarthritis, unspecified knee: Secondary | ICD-10-CM | POA: Diagnosis not present

## 2013-07-31 DIAGNOSIS — B351 Tinea unguium: Secondary | ICD-10-CM | POA: Diagnosis not present

## 2013-07-31 DIAGNOSIS — L851 Acquired keratosis [keratoderma] palmaris et plantaris: Secondary | ICD-10-CM | POA: Diagnosis not present

## 2013-07-31 DIAGNOSIS — I70209 Unspecified atherosclerosis of native arteries of extremities, unspecified extremity: Secondary | ICD-10-CM | POA: Diagnosis not present

## 2013-08-05 DIAGNOSIS — I739 Peripheral vascular disease, unspecified: Secondary | ICD-10-CM | POA: Diagnosis not present

## 2013-08-05 DIAGNOSIS — M1990 Unspecified osteoarthritis, unspecified site: Secondary | ICD-10-CM | POA: Diagnosis not present

## 2013-08-05 DIAGNOSIS — I10 Essential (primary) hypertension: Secondary | ICD-10-CM | POA: Diagnosis not present

## 2013-08-05 DIAGNOSIS — IMO0002 Reserved for concepts with insufficient information to code with codable children: Secondary | ICD-10-CM | POA: Diagnosis not present

## 2013-08-05 DIAGNOSIS — M810 Age-related osteoporosis without current pathological fracture: Secondary | ICD-10-CM | POA: Diagnosis not present

## 2013-08-05 DIAGNOSIS — E538 Deficiency of other specified B group vitamins: Secondary | ICD-10-CM | POA: Diagnosis not present

## 2013-08-05 DIAGNOSIS — E782 Mixed hyperlipidemia: Secondary | ICD-10-CM | POA: Diagnosis not present

## 2013-08-05 DIAGNOSIS — K589 Irritable bowel syndrome without diarrhea: Secondary | ICD-10-CM | POA: Diagnosis not present

## 2013-08-05 DIAGNOSIS — E039 Hypothyroidism, unspecified: Secondary | ICD-10-CM | POA: Diagnosis not present

## 2013-08-27 DIAGNOSIS — Z96659 Presence of unspecified artificial knee joint: Secondary | ICD-10-CM | POA: Diagnosis not present

## 2013-08-27 DIAGNOSIS — Z471 Aftercare following joint replacement surgery: Secondary | ICD-10-CM | POA: Diagnosis not present

## 2013-08-27 DIAGNOSIS — M1710 Unilateral primary osteoarthritis, unspecified knee: Secondary | ICD-10-CM | POA: Diagnosis not present

## 2013-09-09 ENCOUNTER — Other Ambulatory Visit: Payer: Self-pay | Admitting: Dermatology

## 2013-09-09 DIAGNOSIS — L820 Inflamed seborrheic keratosis: Secondary | ICD-10-CM | POA: Diagnosis not present

## 2013-09-09 DIAGNOSIS — D485 Neoplasm of uncertain behavior of skin: Secondary | ICD-10-CM | POA: Diagnosis not present

## 2013-10-09 DIAGNOSIS — B351 Tinea unguium: Secondary | ICD-10-CM | POA: Diagnosis not present

## 2013-10-09 DIAGNOSIS — I70209 Unspecified atherosclerosis of native arteries of extremities, unspecified extremity: Secondary | ICD-10-CM | POA: Diagnosis not present

## 2013-10-09 DIAGNOSIS — L851 Acquired keratosis [keratoderma] palmaris et plantaris: Secondary | ICD-10-CM | POA: Diagnosis not present

## 2013-10-10 DIAGNOSIS — H25019 Cortical age-related cataract, unspecified eye: Secondary | ICD-10-CM | POA: Diagnosis not present

## 2013-10-10 DIAGNOSIS — H2510 Age-related nuclear cataract, unspecified eye: Secondary | ICD-10-CM | POA: Diagnosis not present

## 2013-10-10 DIAGNOSIS — H31019 Macula scars of posterior pole (postinflammatory) (post-traumatic), unspecified eye: Secondary | ICD-10-CM | POA: Diagnosis not present

## 2013-10-16 DIAGNOSIS — K5732 Diverticulitis of large intestine without perforation or abscess without bleeding: Secondary | ICD-10-CM | POA: Diagnosis not present

## 2013-11-11 ENCOUNTER — Other Ambulatory Visit: Payer: Self-pay

## 2013-11-11 DIAGNOSIS — Z853 Personal history of malignant neoplasm of breast: Secondary | ICD-10-CM

## 2013-11-11 DIAGNOSIS — Z1231 Encounter for screening mammogram for malignant neoplasm of breast: Secondary | ICD-10-CM

## 2013-11-11 DIAGNOSIS — Z9889 Other specified postprocedural states: Secondary | ICD-10-CM

## 2013-12-11 DIAGNOSIS — M1710 Unilateral primary osteoarthritis, unspecified knee: Secondary | ICD-10-CM | POA: Diagnosis not present

## 2013-12-17 DIAGNOSIS — K589 Irritable bowel syndrome without diarrhea: Secondary | ICD-10-CM | POA: Diagnosis not present

## 2013-12-17 DIAGNOSIS — IMO0002 Reserved for concepts with insufficient information to code with codable children: Secondary | ICD-10-CM | POA: Diagnosis not present

## 2013-12-17 DIAGNOSIS — M1990 Unspecified osteoarthritis, unspecified site: Secondary | ICD-10-CM | POA: Diagnosis not present

## 2013-12-17 DIAGNOSIS — I739 Peripheral vascular disease, unspecified: Secondary | ICD-10-CM | POA: Diagnosis not present

## 2013-12-17 DIAGNOSIS — I10 Essential (primary) hypertension: Secondary | ICD-10-CM | POA: Diagnosis not present

## 2013-12-17 DIAGNOSIS — E039 Hypothyroidism, unspecified: Secondary | ICD-10-CM | POA: Diagnosis not present

## 2013-12-17 DIAGNOSIS — E782 Mixed hyperlipidemia: Secondary | ICD-10-CM | POA: Diagnosis not present

## 2013-12-17 DIAGNOSIS — M810 Age-related osteoporosis without current pathological fracture: Secondary | ICD-10-CM | POA: Diagnosis not present

## 2013-12-18 DIAGNOSIS — I70209 Unspecified atherosclerosis of native arteries of extremities, unspecified extremity: Secondary | ICD-10-CM | POA: Diagnosis not present

## 2013-12-18 DIAGNOSIS — B351 Tinea unguium: Secondary | ICD-10-CM | POA: Diagnosis not present

## 2013-12-18 DIAGNOSIS — L851 Acquired keratosis [keratoderma] palmaris et plantaris: Secondary | ICD-10-CM | POA: Diagnosis not present

## 2013-12-20 ENCOUNTER — Ambulatory Visit
Admission: RE | Admit: 2013-12-20 | Discharge: 2013-12-20 | Disposition: A | Discharge status: Home / Self Care | Payer: Medicare Other | Source: Ambulatory Visit

## 2013-12-20 DIAGNOSIS — Z853 Personal history of malignant neoplasm of breast: Secondary | ICD-10-CM

## 2013-12-20 DIAGNOSIS — Z1231 Encounter for screening mammogram for malignant neoplasm of breast: Secondary | ICD-10-CM | POA: Diagnosis not present

## 2013-12-20 DIAGNOSIS — Z9889 Other specified postprocedural states: Secondary | ICD-10-CM

## 2014-02-26 DIAGNOSIS — L84 Corns and callosities: Secondary | ICD-10-CM | POA: Diagnosis not present

## 2014-02-26 DIAGNOSIS — I70209 Unspecified atherosclerosis of native arteries of extremities, unspecified extremity: Secondary | ICD-10-CM | POA: Diagnosis not present

## 2014-02-26 DIAGNOSIS — B351 Tinea unguium: Secondary | ICD-10-CM | POA: Diagnosis not present

## 2014-02-28 DIAGNOSIS — Z471 Aftercare following joint replacement surgery: Secondary | ICD-10-CM | POA: Diagnosis not present

## 2014-02-28 DIAGNOSIS — M1712 Unilateral primary osteoarthritis, left knee: Secondary | ICD-10-CM | POA: Diagnosis not present

## 2014-02-28 DIAGNOSIS — Z96651 Presence of right artificial knee joint: Secondary | ICD-10-CM | POA: Diagnosis not present

## 2014-03-13 DIAGNOSIS — Z23 Encounter for immunization: Secondary | ICD-10-CM | POA: Diagnosis not present

## 2014-03-31 IMAGING — MG MM DIGITAL DIAGNOSTIC BILAT CAD
5 series · 5 of 5 positions shown · non-contrast
Comparison: Previous mammograms.

CLINICAL DATA: History of left sided  breast  cancer post
lumpectomy in 9008.

DIGITAL DIAGNOSTIC LEFT MAMMOGRAM

[R CC]
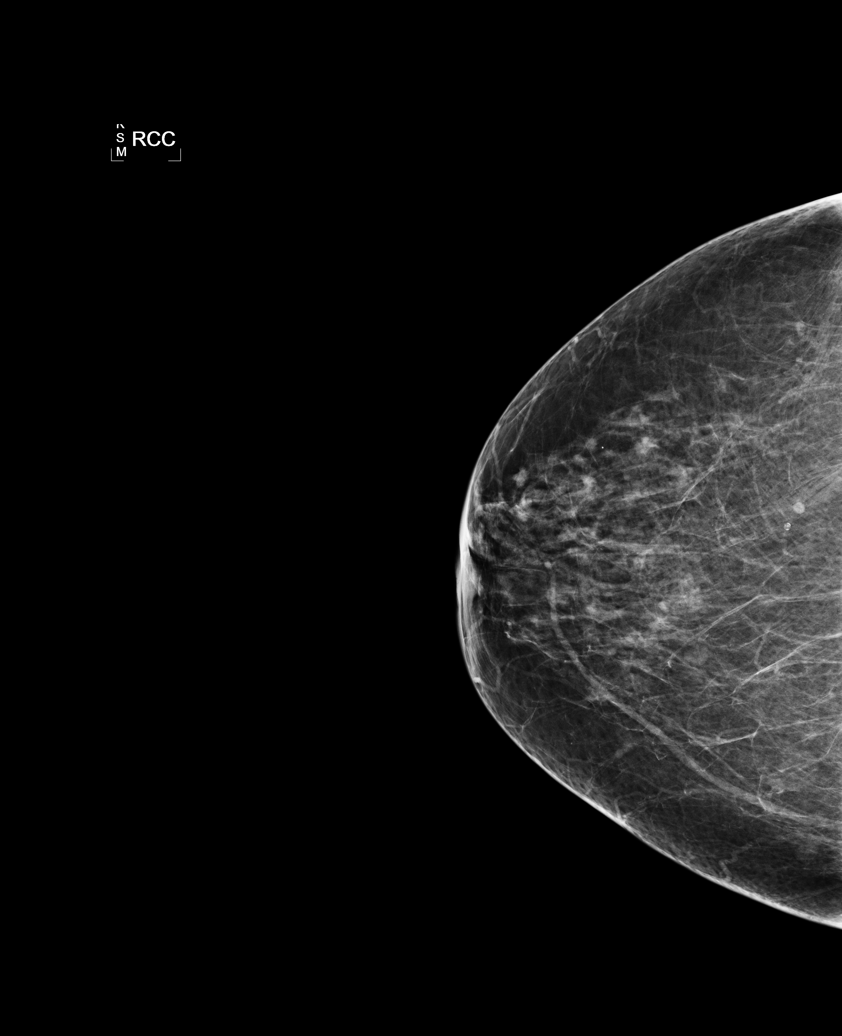

[L CC (1 of 2)]
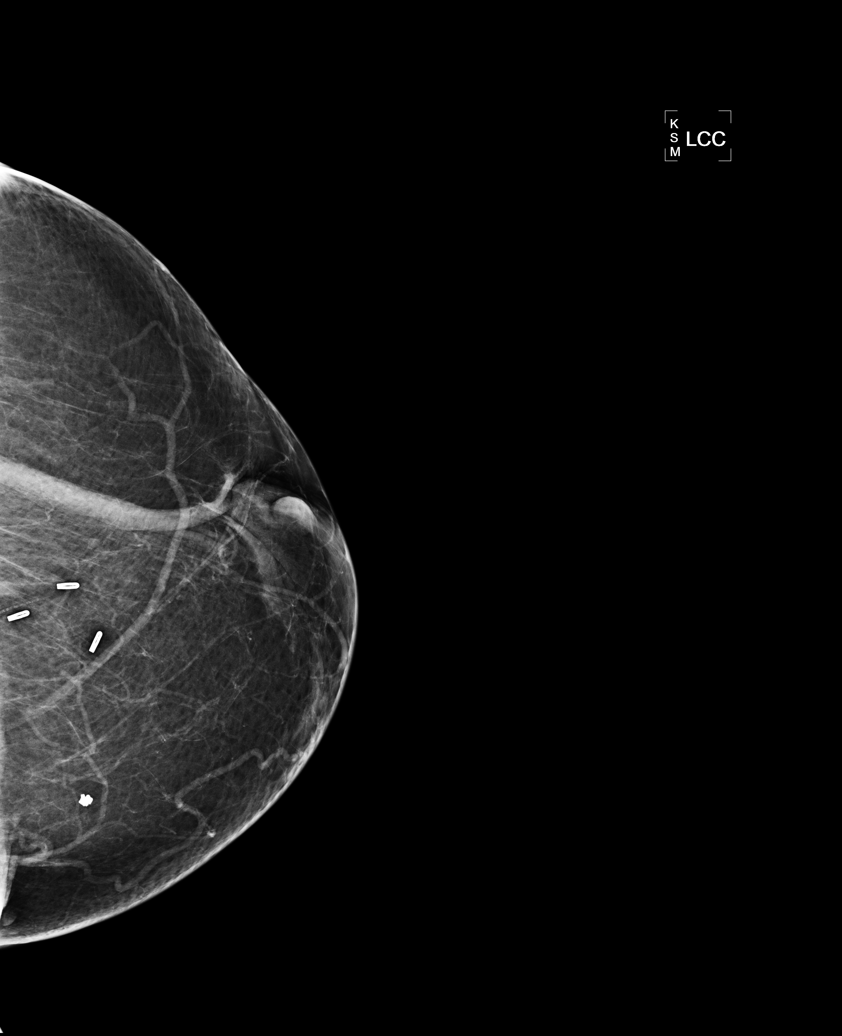

[L MLO]
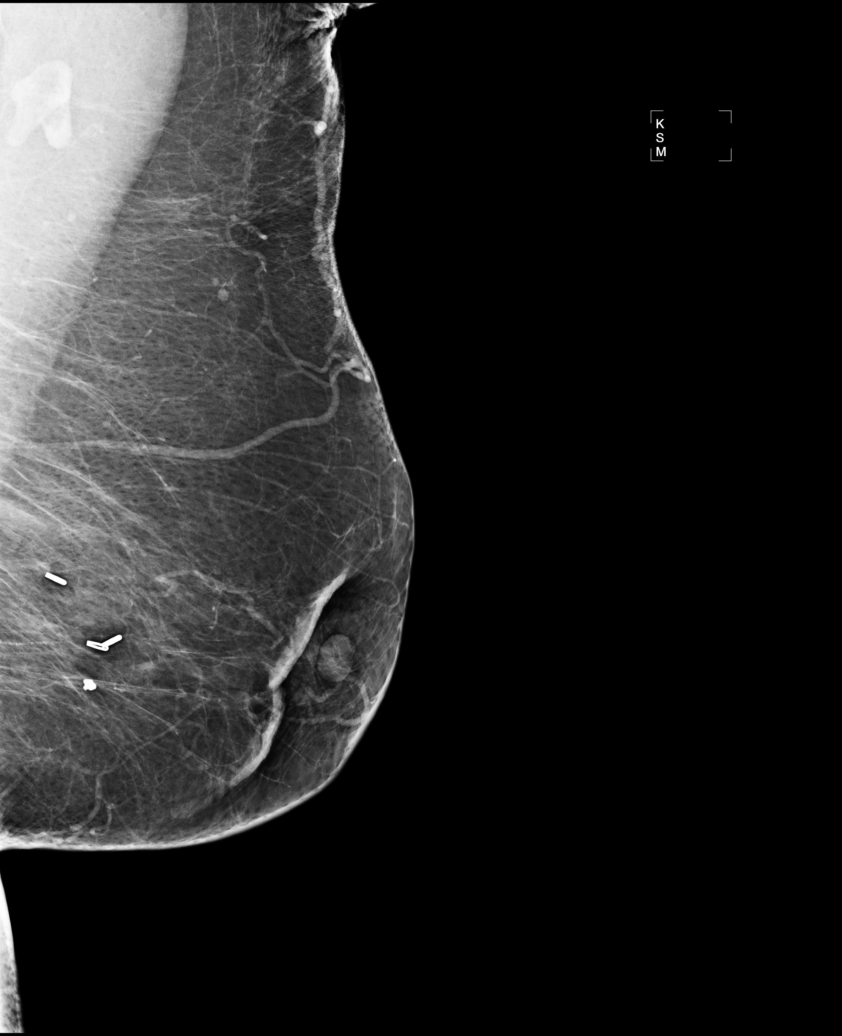

[R MLO]
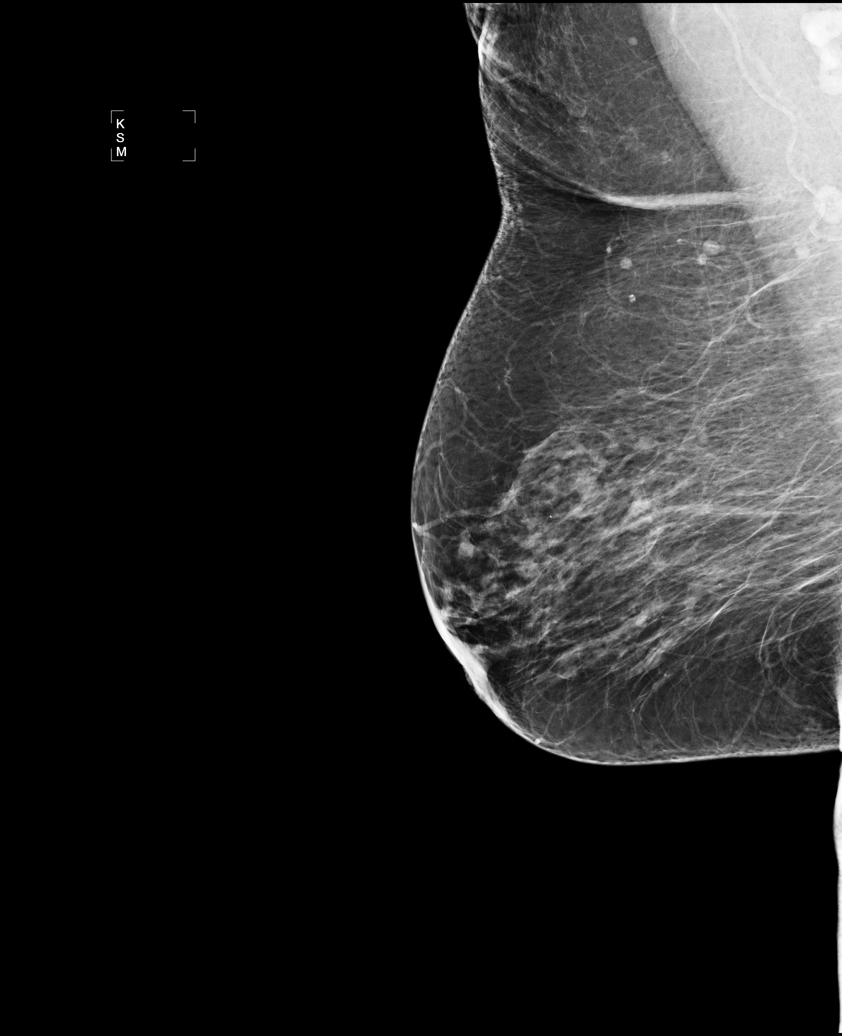

[L CC (2 of 2)]
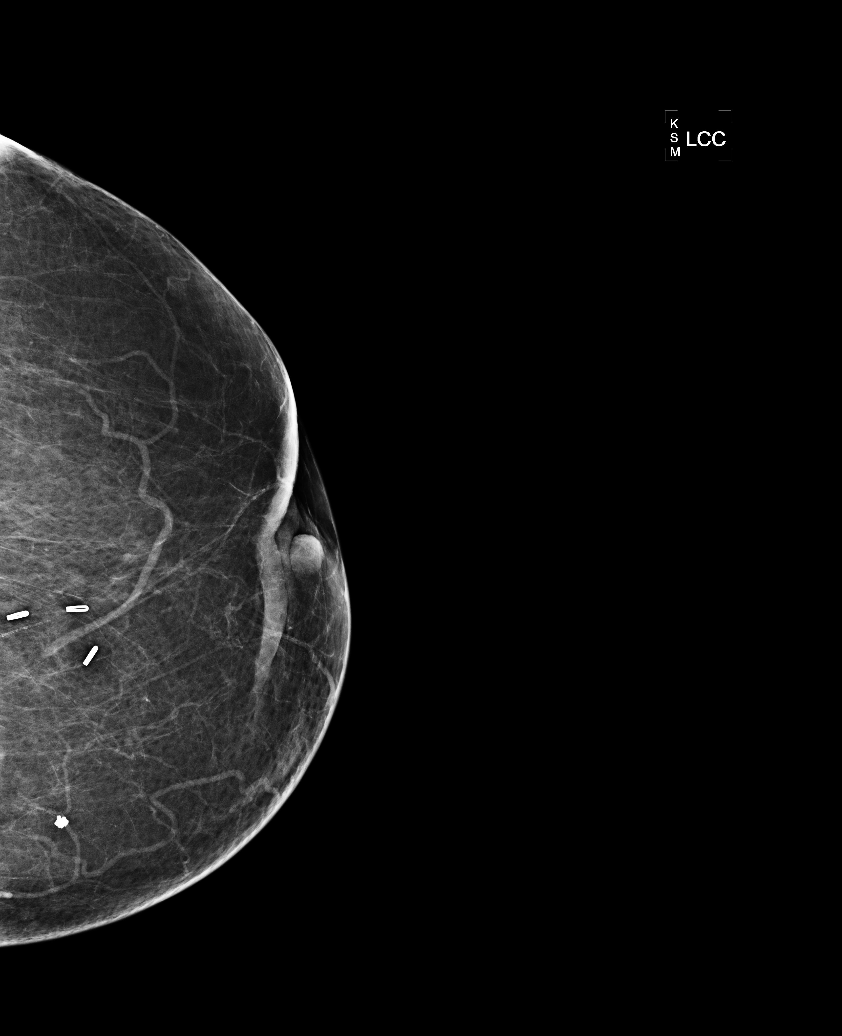

[5 of 5 positions shown; findings below may reference images not displayed]

FINDINGS: ACR Breast Density Category a:  The breast tissue is almost
entirely fatty.

Post lumpectomy changes are present in the left breast.  There are
no suspicious masses or calcifications seen in either breast.
There is no mammographic evidence of malignancy in either breast.

Mammographic images were processed with CAD.
IMPRESSION: No mammographic evidence of malignancy in either breast.

RECOMMENDATION:
Screening mammogram in one year. (Code:[id number]-W-F3B)

I have discussed the findings and recommendations with the patient.
Results were also provided in writing at the conclusion of the
visit.  If applicable, a reminder letter will be sent to the
patient regarding her next appointment.

BI-RADS CATEGORY 1:  Negative.

## 2014-04-02 ENCOUNTER — Other Ambulatory Visit: Payer: Self-pay | Admitting: Dermatology

## 2014-04-02 DIAGNOSIS — L858 Other specified epidermal thickening: Secondary | ICD-10-CM | POA: Diagnosis not present

## 2014-04-02 DIAGNOSIS — D485 Neoplasm of uncertain behavior of skin: Secondary | ICD-10-CM | POA: Diagnosis not present

## 2014-04-02 DIAGNOSIS — L821 Other seborrheic keratosis: Secondary | ICD-10-CM | POA: Diagnosis not present

## 2014-04-16 DIAGNOSIS — E782 Mixed hyperlipidemia: Secondary | ICD-10-CM | POA: Diagnosis not present

## 2014-04-16 DIAGNOSIS — I10 Essential (primary) hypertension: Secondary | ICD-10-CM | POA: Diagnosis not present

## 2014-04-16 DIAGNOSIS — E785 Hyperlipidemia, unspecified: Secondary | ICD-10-CM | POA: Diagnosis not present

## 2014-04-16 DIAGNOSIS — E039 Hypothyroidism, unspecified: Secondary | ICD-10-CM | POA: Diagnosis not present

## 2014-04-24 DIAGNOSIS — I739 Peripheral vascular disease, unspecified: Secondary | ICD-10-CM | POA: Diagnosis not present

## 2014-04-24 DIAGNOSIS — J301 Allergic rhinitis due to pollen: Secondary | ICD-10-CM | POA: Diagnosis not present

## 2014-04-24 DIAGNOSIS — Z1389 Encounter for screening for other disorder: Secondary | ICD-10-CM | POA: Diagnosis not present

## 2014-04-24 DIAGNOSIS — D519 Vitamin B12 deficiency anemia, unspecified: Secondary | ICD-10-CM | POA: Diagnosis not present

## 2014-04-24 DIAGNOSIS — I10 Essential (primary) hypertension: Secondary | ICD-10-CM | POA: Diagnosis not present

## 2014-04-24 DIAGNOSIS — E782 Mixed hyperlipidemia: Secondary | ICD-10-CM | POA: Diagnosis not present

## 2014-04-24 DIAGNOSIS — E039 Hypothyroidism, unspecified: Secondary | ICD-10-CM | POA: Diagnosis not present

## 2014-04-24 DIAGNOSIS — M170 Bilateral primary osteoarthritis of knee: Secondary | ICD-10-CM | POA: Diagnosis not present

## 2014-05-07 DIAGNOSIS — I70203 Unspecified atherosclerosis of native arteries of extremities, bilateral legs: Secondary | ICD-10-CM | POA: Diagnosis not present

## 2014-05-07 DIAGNOSIS — L84 Corns and callosities: Secondary | ICD-10-CM | POA: Diagnosis not present

## 2014-05-07 DIAGNOSIS — B351 Tinea unguium: Secondary | ICD-10-CM | POA: Diagnosis not present

## 2014-07-16 DIAGNOSIS — L84 Corns and callosities: Secondary | ICD-10-CM | POA: Diagnosis not present

## 2014-07-16 DIAGNOSIS — B351 Tinea unguium: Secondary | ICD-10-CM | POA: Diagnosis not present

## 2014-07-16 DIAGNOSIS — I70203 Unspecified atherosclerosis of native arteries of extremities, bilateral legs: Secondary | ICD-10-CM | POA: Diagnosis not present

## 2014-09-18 DIAGNOSIS — E782 Mixed hyperlipidemia: Secondary | ICD-10-CM | POA: Diagnosis not present

## 2014-09-18 DIAGNOSIS — K210 Gastro-esophageal reflux disease with esophagitis: Secondary | ICD-10-CM | POA: Diagnosis not present

## 2014-09-18 DIAGNOSIS — E039 Hypothyroidism, unspecified: Secondary | ICD-10-CM | POA: Diagnosis not present

## 2014-09-24 DIAGNOSIS — B351 Tinea unguium: Secondary | ICD-10-CM | POA: Diagnosis not present

## 2014-09-24 DIAGNOSIS — L84 Corns and callosities: Secondary | ICD-10-CM | POA: Diagnosis not present

## 2014-09-24 DIAGNOSIS — I70203 Unspecified atherosclerosis of native arteries of extremities, bilateral legs: Secondary | ICD-10-CM | POA: Diagnosis not present

## 2014-09-26 DIAGNOSIS — M170 Bilateral primary osteoarthritis of knee: Secondary | ICD-10-CM | POA: Diagnosis not present

## 2014-09-26 DIAGNOSIS — E782 Mixed hyperlipidemia: Secondary | ICD-10-CM | POA: Diagnosis not present

## 2014-09-26 DIAGNOSIS — K580 Irritable bowel syndrome with diarrhea: Secondary | ICD-10-CM | POA: Diagnosis not present

## 2014-09-26 DIAGNOSIS — I739 Peripheral vascular disease, unspecified: Secondary | ICD-10-CM | POA: Diagnosis not present

## 2014-09-26 DIAGNOSIS — K219 Gastro-esophageal reflux disease without esophagitis: Secondary | ICD-10-CM | POA: Diagnosis not present

## 2014-09-26 DIAGNOSIS — E039 Hypothyroidism, unspecified: Secondary | ICD-10-CM | POA: Diagnosis not present

## 2014-09-26 DIAGNOSIS — J301 Allergic rhinitis due to pollen: Secondary | ICD-10-CM | POA: Diagnosis not present

## 2014-09-26 DIAGNOSIS — I10 Essential (primary) hypertension: Secondary | ICD-10-CM | POA: Diagnosis not present

## 2014-09-26 DIAGNOSIS — D519 Vitamin B12 deficiency anemia, unspecified: Secondary | ICD-10-CM | POA: Diagnosis not present

## 2014-10-16 DIAGNOSIS — H35341 Macular cyst, hole, or pseudohole, right eye: Secondary | ICD-10-CM | POA: Diagnosis not present

## 2014-11-17 ENCOUNTER — Other Ambulatory Visit: Payer: Self-pay

## 2014-11-17 DIAGNOSIS — Z1231 Encounter for screening mammogram for malignant neoplasm of breast: Secondary | ICD-10-CM

## 2014-12-10 DIAGNOSIS — I70203 Unspecified atherosclerosis of native arteries of extremities, bilateral legs: Secondary | ICD-10-CM | POA: Diagnosis not present

## 2014-12-10 DIAGNOSIS — L84 Corns and callosities: Secondary | ICD-10-CM | POA: Diagnosis not present

## 2014-12-10 DIAGNOSIS — B351 Tinea unguium: Secondary | ICD-10-CM | POA: Diagnosis not present

## 2014-12-22 ENCOUNTER — Ambulatory Visit
Admission: RE | Admit: 2014-12-22 | Discharge: 2014-12-22 | Disposition: A | Discharge status: Home / Self Care | Payer: Medicare Other | Source: Ambulatory Visit

## 2014-12-22 DIAGNOSIS — Z1231 Encounter for screening mammogram for malignant neoplasm of breast: Secondary | ICD-10-CM | POA: Diagnosis not present

## 2015-02-09 DIAGNOSIS — K219 Gastro-esophageal reflux disease without esophagitis: Secondary | ICD-10-CM | POA: Diagnosis not present

## 2015-02-09 DIAGNOSIS — Z23 Encounter for immunization: Secondary | ICD-10-CM | POA: Diagnosis not present

## 2015-02-09 DIAGNOSIS — I10 Essential (primary) hypertension: Secondary | ICD-10-CM | POA: Diagnosis not present

## 2015-02-09 DIAGNOSIS — E782 Mixed hyperlipidemia: Secondary | ICD-10-CM | POA: Diagnosis not present

## 2015-02-09 DIAGNOSIS — D519 Vitamin B12 deficiency anemia, unspecified: Secondary | ICD-10-CM | POA: Diagnosis not present

## 2015-02-09 DIAGNOSIS — I739 Peripheral vascular disease, unspecified: Secondary | ICD-10-CM | POA: Diagnosis not present

## 2015-02-09 DIAGNOSIS — M170 Bilateral primary osteoarthritis of knee: Secondary | ICD-10-CM | POA: Diagnosis not present

## 2015-02-09 DIAGNOSIS — K580 Irritable bowel syndrome with diarrhea: Secondary | ICD-10-CM | POA: Diagnosis not present

## 2015-02-18 ENCOUNTER — Other Ambulatory Visit: Payer: Self-pay | Admitting: Dermatology

## 2015-02-18 DIAGNOSIS — L820 Inflamed seborrheic keratosis: Secondary | ICD-10-CM | POA: Diagnosis not present

## 2015-02-18 DIAGNOSIS — D485 Neoplasm of uncertain behavior of skin: Secondary | ICD-10-CM | POA: Diagnosis not present

## 2015-02-18 DIAGNOSIS — L570 Actinic keratosis: Secondary | ICD-10-CM | POA: Diagnosis not present

## 2015-02-18 DIAGNOSIS — L821 Other seborrheic keratosis: Secondary | ICD-10-CM | POA: Diagnosis not present

## 2015-02-18 DIAGNOSIS — D239 Other benign neoplasm of skin, unspecified: Secondary | ICD-10-CM | POA: Diagnosis not present

## 2015-02-25 DIAGNOSIS — L84 Corns and callosities: Secondary | ICD-10-CM | POA: Diagnosis not present

## 2015-02-25 DIAGNOSIS — I70203 Unspecified atherosclerosis of native arteries of extremities, bilateral legs: Secondary | ICD-10-CM | POA: Diagnosis not present

## 2015-02-25 DIAGNOSIS — B351 Tinea unguium: Secondary | ICD-10-CM | POA: Diagnosis not present

## 2015-05-13 DIAGNOSIS — B351 Tinea unguium: Secondary | ICD-10-CM | POA: Diagnosis not present

## 2015-05-13 DIAGNOSIS — I70203 Unspecified atherosclerosis of native arteries of extremities, bilateral legs: Secondary | ICD-10-CM | POA: Diagnosis not present

## 2015-05-13 DIAGNOSIS — L84 Corns and callosities: Secondary | ICD-10-CM | POA: Diagnosis not present

## 2015-06-17 DIAGNOSIS — E782 Mixed hyperlipidemia: Secondary | ICD-10-CM | POA: Diagnosis not present

## 2015-06-17 DIAGNOSIS — E039 Hypothyroidism, unspecified: Secondary | ICD-10-CM | POA: Diagnosis not present

## 2015-06-17 DIAGNOSIS — D519 Vitamin B12 deficiency anemia, unspecified: Secondary | ICD-10-CM | POA: Diagnosis not present

## 2015-06-17 DIAGNOSIS — K219 Gastro-esophageal reflux disease without esophagitis: Secondary | ICD-10-CM | POA: Diagnosis not present

## 2015-06-17 DIAGNOSIS — I10 Essential (primary) hypertension: Secondary | ICD-10-CM | POA: Diagnosis not present

## 2015-06-17 DIAGNOSIS — E559 Vitamin D deficiency, unspecified: Secondary | ICD-10-CM | POA: Diagnosis not present

## 2015-06-23 DIAGNOSIS — E039 Hypothyroidism, unspecified: Secondary | ICD-10-CM | POA: Diagnosis not present

## 2015-06-23 DIAGNOSIS — I10 Essential (primary) hypertension: Secondary | ICD-10-CM | POA: Diagnosis not present

## 2015-06-23 DIAGNOSIS — J301 Allergic rhinitis due to pollen: Secondary | ICD-10-CM | POA: Diagnosis not present

## 2015-06-23 DIAGNOSIS — K219 Gastro-esophageal reflux disease without esophagitis: Secondary | ICD-10-CM | POA: Diagnosis not present

## 2015-06-23 DIAGNOSIS — K580 Irritable bowel syndrome with diarrhea: Secondary | ICD-10-CM | POA: Diagnosis not present

## 2015-06-23 DIAGNOSIS — R072 Precordial pain: Secondary | ICD-10-CM | POA: Diagnosis not present

## 2015-06-23 DIAGNOSIS — E782 Mixed hyperlipidemia: Secondary | ICD-10-CM | POA: Diagnosis not present

## 2015-06-23 DIAGNOSIS — I739 Peripheral vascular disease, unspecified: Secondary | ICD-10-CM | POA: Diagnosis not present

## 2015-06-23 DIAGNOSIS — R0602 Shortness of breath: Secondary | ICD-10-CM | POA: Diagnosis not present

## 2015-06-23 DIAGNOSIS — M170 Bilateral primary osteoarthritis of knee: Secondary | ICD-10-CM | POA: Diagnosis not present

## 2015-06-29 ENCOUNTER — Other Ambulatory Visit: Payer: Self-pay | Admitting: Dermatology

## 2015-06-29 DIAGNOSIS — L570 Actinic keratosis: Secondary | ICD-10-CM | POA: Diagnosis not present

## 2015-06-29 DIAGNOSIS — H43393 Other vitreous opacities, bilateral: Secondary | ICD-10-CM | POA: Diagnosis not present

## 2015-06-29 DIAGNOSIS — H35341 Macular cyst, hole, or pseudohole, right eye: Secondary | ICD-10-CM | POA: Diagnosis not present

## 2015-06-29 DIAGNOSIS — D485 Neoplasm of uncertain behavior of skin: Secondary | ICD-10-CM | POA: Diagnosis not present

## 2015-07-22 DIAGNOSIS — B351 Tinea unguium: Secondary | ICD-10-CM | POA: Diagnosis not present

## 2015-07-22 DIAGNOSIS — L84 Corns and callosities: Secondary | ICD-10-CM | POA: Diagnosis not present

## 2015-07-22 DIAGNOSIS — I70203 Unspecified atherosclerosis of native arteries of extremities, bilateral legs: Secondary | ICD-10-CM | POA: Diagnosis not present

## 2015-09-30 DIAGNOSIS — L84 Corns and callosities: Secondary | ICD-10-CM | POA: Diagnosis not present

## 2015-09-30 DIAGNOSIS — B351 Tinea unguium: Secondary | ICD-10-CM | POA: Diagnosis not present

## 2015-09-30 DIAGNOSIS — I70203 Unspecified atherosclerosis of native arteries of extremities, bilateral legs: Secondary | ICD-10-CM | POA: Diagnosis not present

## 2015-10-26 DIAGNOSIS — I10 Essential (primary) hypertension: Secondary | ICD-10-CM | POA: Diagnosis not present

## 2015-10-26 DIAGNOSIS — K580 Irritable bowel syndrome with diarrhea: Secondary | ICD-10-CM | POA: Diagnosis not present

## 2015-10-26 DIAGNOSIS — J301 Allergic rhinitis due to pollen: Secondary | ICD-10-CM | POA: Diagnosis not present

## 2015-10-26 DIAGNOSIS — M170 Bilateral primary osteoarthritis of knee: Secondary | ICD-10-CM | POA: Diagnosis not present

## 2015-10-26 DIAGNOSIS — E039 Hypothyroidism, unspecified: Secondary | ICD-10-CM | POA: Diagnosis not present

## 2015-10-26 DIAGNOSIS — I739 Peripheral vascular disease, unspecified: Secondary | ICD-10-CM | POA: Diagnosis not present

## 2015-10-26 DIAGNOSIS — K219 Gastro-esophageal reflux disease without esophagitis: Secondary | ICD-10-CM | POA: Diagnosis not present

## 2015-10-26 DIAGNOSIS — E782 Mixed hyperlipidemia: Secondary | ICD-10-CM | POA: Diagnosis not present

## 2015-11-10 DIAGNOSIS — Z471 Aftercare following joint replacement surgery: Secondary | ICD-10-CM | POA: Diagnosis not present

## 2015-11-10 DIAGNOSIS — M1712 Unilateral primary osteoarthritis, left knee: Secondary | ICD-10-CM | POA: Diagnosis not present

## 2015-11-10 DIAGNOSIS — Z96651 Presence of right artificial knee joint: Secondary | ICD-10-CM | POA: Diagnosis not present

## 2015-12-11 ENCOUNTER — Other Ambulatory Visit: Payer: Self-pay | Admitting: Family Medicine

## 2015-12-11 DIAGNOSIS — Z1231 Encounter for screening mammogram for malignant neoplasm of breast: Secondary | ICD-10-CM

## 2015-12-18 DIAGNOSIS — L84 Corns and callosities: Secondary | ICD-10-CM | POA: Diagnosis not present

## 2015-12-18 DIAGNOSIS — B351 Tinea unguium: Secondary | ICD-10-CM | POA: Diagnosis not present

## 2015-12-18 DIAGNOSIS — I70203 Unspecified atherosclerosis of native arteries of extremities, bilateral legs: Secondary | ICD-10-CM | POA: Diagnosis not present

## 2015-12-22 DIAGNOSIS — M1712 Unilateral primary osteoarthritis, left knee: Secondary | ICD-10-CM | POA: Diagnosis not present

## 2015-12-22 DIAGNOSIS — M25562 Pain in left knee: Secondary | ICD-10-CM | POA: Diagnosis not present

## 2015-12-29 ENCOUNTER — Ambulatory Visit
Admission: RE | Admit: 2015-12-29 | Discharge: 2015-12-29 | Disposition: A | Discharge status: Home / Self Care | Payer: Medicare Other | Source: Ambulatory Visit | Attending: Family Medicine | Admitting: Family Medicine

## 2015-12-29 DIAGNOSIS — Z1231 Encounter for screening mammogram for malignant neoplasm of breast: Secondary | ICD-10-CM

## 2016-01-14 DIAGNOSIS — M1712 Unilateral primary osteoarthritis, left knee: Secondary | ICD-10-CM | POA: Diagnosis not present

## 2016-01-15 ENCOUNTER — Other Ambulatory Visit: Payer: Self-pay

## 2016-01-21 DIAGNOSIS — M1712 Unilateral primary osteoarthritis, left knee: Secondary | ICD-10-CM | POA: Diagnosis not present

## 2016-01-29 DIAGNOSIS — M1712 Unilateral primary osteoarthritis, left knee: Secondary | ICD-10-CM | POA: Diagnosis not present

## 2016-02-19 DIAGNOSIS — L84 Corns and callosities: Secondary | ICD-10-CM | POA: Diagnosis not present

## 2016-02-19 DIAGNOSIS — I70203 Unspecified atherosclerosis of native arteries of extremities, bilateral legs: Secondary | ICD-10-CM | POA: Diagnosis not present

## 2016-02-19 DIAGNOSIS — B351 Tinea unguium: Secondary | ICD-10-CM | POA: Diagnosis not present

## 2016-03-24 DIAGNOSIS — M1712 Unilateral primary osteoarthritis, left knee: Secondary | ICD-10-CM | POA: Diagnosis not present

## 2016-04-01 DIAGNOSIS — E782 Mixed hyperlipidemia: Secondary | ICD-10-CM | POA: Diagnosis not present

## 2016-04-01 DIAGNOSIS — D519 Vitamin B12 deficiency anemia, unspecified: Secondary | ICD-10-CM | POA: Diagnosis not present

## 2016-04-01 DIAGNOSIS — K210 Gastro-esophageal reflux disease with esophagitis: Secondary | ICD-10-CM | POA: Diagnosis not present

## 2016-04-01 DIAGNOSIS — I10 Essential (primary) hypertension: Secondary | ICD-10-CM | POA: Diagnosis not present

## 2016-04-01 DIAGNOSIS — E039 Hypothyroidism, unspecified: Secondary | ICD-10-CM | POA: Diagnosis not present

## 2016-04-05 DIAGNOSIS — E039 Hypothyroidism, unspecified: Secondary | ICD-10-CM | POA: Diagnosis not present

## 2016-04-05 DIAGNOSIS — E782 Mixed hyperlipidemia: Secondary | ICD-10-CM | POA: Diagnosis not present

## 2016-04-05 DIAGNOSIS — Z6831 Body mass index (BMI) 31.0-31.9, adult: Secondary | ICD-10-CM | POA: Diagnosis not present

## 2016-04-05 DIAGNOSIS — K219 Gastro-esophageal reflux disease without esophagitis: Secondary | ICD-10-CM | POA: Diagnosis not present

## 2016-04-05 DIAGNOSIS — J301 Allergic rhinitis due to pollen: Secondary | ICD-10-CM | POA: Diagnosis not present

## 2016-04-05 DIAGNOSIS — Z23 Encounter for immunization: Secondary | ICD-10-CM | POA: Diagnosis not present

## 2016-04-05 DIAGNOSIS — I739 Peripheral vascular disease, unspecified: Secondary | ICD-10-CM | POA: Diagnosis not present

## 2016-04-05 DIAGNOSIS — I10 Essential (primary) hypertension: Secondary | ICD-10-CM | POA: Diagnosis not present

## 2016-04-29 DIAGNOSIS — L84 Corns and callosities: Secondary | ICD-10-CM | POA: Diagnosis not present

## 2016-04-29 DIAGNOSIS — I70203 Unspecified atherosclerosis of native arteries of extremities, bilateral legs: Secondary | ICD-10-CM | POA: Diagnosis not present

## 2016-04-29 DIAGNOSIS — B351 Tinea unguium: Secondary | ICD-10-CM | POA: Diagnosis not present

## 2016-05-23 HISTORY — PX: BREAST EXCISIONAL BIOPSY: SUR124

## 2016-05-30 DIAGNOSIS — Z79899 Other long term (current) drug therapy: Secondary | ICD-10-CM | POA: Diagnosis not present

## 2016-05-30 DIAGNOSIS — Z853 Personal history of malignant neoplasm of breast: Secondary | ICD-10-CM | POA: Diagnosis not present

## 2016-05-30 DIAGNOSIS — Z96651 Presence of right artificial knee joint: Secondary | ICD-10-CM | POA: Diagnosis not present

## 2016-05-30 DIAGNOSIS — E039 Hypothyroidism, unspecified: Secondary | ICD-10-CM | POA: Diagnosis not present

## 2016-05-30 DIAGNOSIS — M810 Age-related osteoporosis without current pathological fracture: Secondary | ICD-10-CM | POA: Diagnosis not present

## 2016-05-30 DIAGNOSIS — Z780 Asymptomatic menopausal state: Secondary | ICD-10-CM | POA: Diagnosis not present

## 2016-08-22 DIAGNOSIS — K5732 Diverticulitis of large intestine without perforation or abscess without bleeding: Secondary | ICD-10-CM | POA: Diagnosis not present

## 2016-08-22 DIAGNOSIS — Z6832 Body mass index (BMI) 32.0-32.9, adult: Secondary | ICD-10-CM | POA: Diagnosis not present

## 2016-08-23 DIAGNOSIS — D229 Melanocytic nevi, unspecified: Secondary | ICD-10-CM | POA: Diagnosis not present

## 2016-08-23 DIAGNOSIS — Z85828 Personal history of other malignant neoplasm of skin: Secondary | ICD-10-CM | POA: Diagnosis not present

## 2016-08-23 DIAGNOSIS — L570 Actinic keratosis: Secondary | ICD-10-CM | POA: Diagnosis not present

## 2016-09-29 DIAGNOSIS — I10 Essential (primary) hypertension: Secondary | ICD-10-CM | POA: Diagnosis not present

## 2016-09-29 DIAGNOSIS — E782 Mixed hyperlipidemia: Secondary | ICD-10-CM | POA: Diagnosis not present

## 2016-09-29 DIAGNOSIS — K210 Gastro-esophageal reflux disease with esophagitis: Secondary | ICD-10-CM | POA: Diagnosis not present

## 2016-09-29 DIAGNOSIS — E039 Hypothyroidism, unspecified: Secondary | ICD-10-CM | POA: Diagnosis not present

## 2016-09-29 DIAGNOSIS — Z9189 Other specified personal risk factors, not elsewhere classified: Secondary | ICD-10-CM | POA: Diagnosis not present

## 2016-10-03 ENCOUNTER — Ambulatory Visit (INDEPENDENT_AMBULATORY_CARE_PROVIDER_SITE_OTHER): Payer: Medicare Other | Admitting: Otolaryngology

## 2016-10-03 DIAGNOSIS — L0100 Impetigo, unspecified: Secondary | ICD-10-CM | POA: Diagnosis not present

## 2016-10-06 DIAGNOSIS — E039 Hypothyroidism, unspecified: Secondary | ICD-10-CM | POA: Diagnosis not present

## 2016-10-06 DIAGNOSIS — K219 Gastro-esophageal reflux disease without esophagitis: Secondary | ICD-10-CM | POA: Diagnosis not present

## 2016-10-06 DIAGNOSIS — E782 Mixed hyperlipidemia: Secondary | ICD-10-CM | POA: Diagnosis not present

## 2016-10-06 DIAGNOSIS — Z6832 Body mass index (BMI) 32.0-32.9, adult: Secondary | ICD-10-CM | POA: Diagnosis not present

## 2016-10-06 DIAGNOSIS — K5732 Diverticulitis of large intestine without perforation or abscess without bleeding: Secondary | ICD-10-CM | POA: Diagnosis not present

## 2016-10-06 DIAGNOSIS — I10 Essential (primary) hypertension: Secondary | ICD-10-CM | POA: Diagnosis not present

## 2016-10-06 DIAGNOSIS — I739 Peripheral vascular disease, unspecified: Secondary | ICD-10-CM | POA: Diagnosis not present

## 2016-10-06 DIAGNOSIS — K580 Irritable bowel syndrome with diarrhea: Secondary | ICD-10-CM | POA: Diagnosis not present

## 2016-11-01 DIAGNOSIS — H2512 Age-related nuclear cataract, left eye: Secondary | ICD-10-CM | POA: Diagnosis not present

## 2016-11-01 DIAGNOSIS — H35341 Macular cyst, hole, or pseudohole, right eye: Secondary | ICD-10-CM | POA: Diagnosis not present

## 2016-12-06 ENCOUNTER — Other Ambulatory Visit: Payer: Self-pay | Admitting: Family Medicine

## 2016-12-06 DIAGNOSIS — Z1231 Encounter for screening mammogram for malignant neoplasm of breast: Secondary | ICD-10-CM

## 2016-12-29 ENCOUNTER — Ambulatory Visit
Admission: RE | Admit: 2016-12-29 | Discharge: 2016-12-29 | Disposition: A | Discharge status: Home / Self Care | Payer: Medicare Other | Source: Ambulatory Visit | Attending: Family Medicine | Admitting: Family Medicine

## 2016-12-29 DIAGNOSIS — Z1231 Encounter for screening mammogram for malignant neoplasm of breast: Secondary | ICD-10-CM | POA: Diagnosis not present

## 2017-01-02 ENCOUNTER — Other Ambulatory Visit: Payer: Self-pay | Admitting: Family Medicine

## 2017-01-02 DIAGNOSIS — R928 Other abnormal and inconclusive findings on diagnostic imaging of breast: Secondary | ICD-10-CM

## 2017-01-09 ENCOUNTER — Other Ambulatory Visit: Payer: Self-pay | Admitting: Family Medicine

## 2017-01-09 ENCOUNTER — Ambulatory Visit
Admission: RE | Admit: 2017-01-09 | Discharge: 2017-01-09 | Disposition: A | Discharge status: Home / Self Care | Payer: Medicare Other | Source: Ambulatory Visit | Attending: Family Medicine | Admitting: Family Medicine

## 2017-01-09 DIAGNOSIS — R928 Other abnormal and inconclusive findings on diagnostic imaging of breast: Secondary | ICD-10-CM

## 2017-01-09 DIAGNOSIS — N6320 Unspecified lump in the left breast, unspecified quadrant: Secondary | ICD-10-CM

## 2017-01-09 DIAGNOSIS — R921 Mammographic calcification found on diagnostic imaging of breast: Secondary | ICD-10-CM

## 2017-01-09 DIAGNOSIS — N6489 Other specified disorders of breast: Secondary | ICD-10-CM | POA: Diagnosis not present

## 2017-01-13 DIAGNOSIS — H9203 Otalgia, bilateral: Secondary | ICD-10-CM | POA: Diagnosis not present

## 2017-01-13 DIAGNOSIS — Z6831 Body mass index (BMI) 31.0-31.9, adult: Secondary | ICD-10-CM | POA: Diagnosis not present

## 2017-01-17 ENCOUNTER — Ambulatory Visit
Admission: RE | Admit: 2017-01-17 | Discharge: 2017-01-17 | Disposition: A | Discharge status: Home / Self Care | Payer: Medicare Other | Source: Ambulatory Visit | Attending: Family Medicine | Admitting: Family Medicine

## 2017-01-17 ENCOUNTER — Other Ambulatory Visit: Payer: Self-pay | Admitting: Family Medicine

## 2017-01-17 DIAGNOSIS — N6320 Unspecified lump in the left breast, unspecified quadrant: Secondary | ICD-10-CM

## 2017-01-17 DIAGNOSIS — R921 Mammographic calcification found on diagnostic imaging of breast: Secondary | ICD-10-CM

## 2017-01-17 DIAGNOSIS — N6342 Unspecified lump in left breast, subareolar: Secondary | ICD-10-CM | POA: Diagnosis not present

## 2017-01-17 DIAGNOSIS — D242 Benign neoplasm of left breast: Secondary | ICD-10-CM | POA: Diagnosis not present

## 2017-01-18 ENCOUNTER — Ambulatory Visit
Admission: RE | Admit: 2017-01-18 | Discharge: 2017-01-18 | Disposition: A | Discharge status: Home / Self Care | Payer: Medicare Other | Source: Ambulatory Visit | Attending: Family Medicine | Admitting: Family Medicine

## 2017-01-18 DIAGNOSIS — R921 Mammographic calcification found on diagnostic imaging of breast: Secondary | ICD-10-CM

## 2017-01-18 DIAGNOSIS — N6320 Unspecified lump in the left breast, unspecified quadrant: Secondary | ICD-10-CM

## 2017-01-18 DIAGNOSIS — N6489 Other specified disorders of breast: Secondary | ICD-10-CM | POA: Diagnosis not present

## 2017-02-13 ENCOUNTER — Other Ambulatory Visit: Payer: Self-pay | Admitting: General Surgery

## 2017-02-13 ENCOUNTER — Ambulatory Visit: Payer: Self-pay | Admitting: General Surgery

## 2017-02-13 DIAGNOSIS — R921 Mammographic calcification found on diagnostic imaging of breast: Secondary | ICD-10-CM

## 2017-02-17 NOTE — Pre-Procedure Instructions (Signed)
Dorothy Glenn  02/17/2017      [facility] - [place], [place], [place] [address] [place] [address] Phone: [phone] Fax: [phone]    Your procedure is scheduled on 02/22/2017.  Report to [facility] Admitting at 0900 A.M.  Call this number if you have problems the morning of surgery:  [phone]   Remember:  Do not eat food or drink liquids after midnight.  Take these medicines the morning of surgery with A SIP OF WATER: Acetaminophen (Tylenol) - if needed Levothyroxine (Synthroid) Metoprolol succinate (Toprol-XL)  7 days prior to surgery STOP taking any Aspirin, Aleve, Naproxen, Ibuprofen, Motrin, Advil, Goody's, BC's, all herbal medications, fish oil, and all vitamins    Do not wear jewelry, make-up or nail polish.  Do not wear lotions, powders, or perfumes, or deodorant.  Do not shave 48 hours prior to surgery.    Do not bring valuables to the hospital.  [facility] is not responsible for any belongings or valuables.  Contacts, eyeglasses, dentures or bridgework may not be worn into surgery.  Leave your suitcase in the car.  After surgery it may be brought to your room.  For patients admitted to the hospital, discharge time will be determined by your treatment team.  Patients discharged the day of surgery will not be allowed to drive home.   Name and phone number of your driver:    Special instructions:   [place]- Preparing For Surgery  Before surgery, you can play an important role. Because skin is not sterile, your skin needs to be as free of germs as possible. You can reduce the number of germs on your skin by washing with CHG (chlorahexidine gluconate) Soap before surgery.  CHG is an antiseptic cleaner which kills germs and bonds with the skin to continue killing germs even after washing.  Please do not use if you have an allergy to CHG or antibacterial soaps. If your skin becomes reddened/irritated stop using  the CHG.  Do not shave (including legs and underarms) for at least 48 hours prior to first CHG shower. It is OK to shave your face.  Please follow these instructions carefully.   1. Shower the NIGHT BEFORE SURGERY and the MORNING OF SURGERY with CHG.   2. If you chose to wash your hair, wash your hair first as usual with your normal shampoo.  3. After you shampoo, rinse your hair and body thoroughly to remove the shampoo.  4. Use CHG as you would any other liquid soap. You can apply CHG directly to the skin and wash gently with a scrungie or a clean washcloth.   5. Apply the CHG Soap to your body ONLY FROM THE NECK DOWN.  Do not use on open wounds or open sores. Avoid contact with your eyes, ears, mouth and genitals (private parts). Wash genitals (private parts) with your normal soap.  6. Wash thoroughly, paying special attention to the area where your surgery will be performed.  7. Thoroughly rinse your body with warm water from the neck down.  8. DO NOT shower/wash with your normal soap after using and rinsing off the CHG Soap.  9. Pat yourself dry with a CLEAN TOWEL.   10. Wear CLEAN PAJAMAS   11. Place CLEAN SHEETS on your bed the night of your first shower and DO NOT SLEEP WITH PETS.    Day of Surgery: Do not apply any deodorants/lotions.  Please wear clean clothes to the hospital/surgery center.      Please read over the following fact sheets that you were given. Pain Booklet, Coughing and Deep Breathing and Surgical Site Infection Prevention

## 2017-02-20 ENCOUNTER — Other Ambulatory Visit: Payer: Self-pay

## 2017-02-20 ENCOUNTER — Encounter (HOSPITAL_COMMUNITY): Payer: Self-pay

## 2017-02-20 ENCOUNTER — Encounter (HOSPITAL_COMMUNITY)
Admission: RE | Admit: 2017-02-20 | Discharge: 2017-02-20 | Disposition: A | Discharge status: Home / Self Care | Payer: Medicare Other | Source: Ambulatory Visit | Attending: General Surgery | Admitting: General Surgery

## 2017-02-20 DIAGNOSIS — K219 Gastro-esophageal reflux disease without esophagitis: Secondary | ICD-10-CM | POA: Diagnosis not present

## 2017-02-20 DIAGNOSIS — K759 Inflammatory liver disease, unspecified: Secondary | ICD-10-CM | POA: Diagnosis not present

## 2017-02-20 DIAGNOSIS — E039 Hypothyroidism, unspecified: Secondary | ICD-10-CM | POA: Diagnosis not present

## 2017-02-20 DIAGNOSIS — Z79899 Other long term (current) drug therapy: Secondary | ICD-10-CM | POA: Diagnosis not present

## 2017-02-20 DIAGNOSIS — D0512 Intraductal carcinoma in situ of left breast: Secondary | ICD-10-CM | POA: Diagnosis not present

## 2017-02-20 DIAGNOSIS — I10 Essential (primary) hypertension: Secondary | ICD-10-CM | POA: Diagnosis not present

## 2017-02-20 DIAGNOSIS — R921 Mammographic calcification found on diagnostic imaging of breast: Secondary | ICD-10-CM | POA: Diagnosis not present

## 2017-02-20 DIAGNOSIS — Z853 Personal history of malignant neoplasm of breast: Secondary | ICD-10-CM | POA: Diagnosis not present

## 2017-02-20 DIAGNOSIS — Z87891 Personal history of nicotine dependence: Secondary | ICD-10-CM | POA: Diagnosis not present

## 2017-02-20 DIAGNOSIS — Z9841 Cataract extraction status, right eye: Secondary | ICD-10-CM | POA: Diagnosis not present

## 2017-02-20 HISTORY — DX: Cardiac arrhythmia, unspecified: I49.9

## 2017-02-20 HISTORY — DX: Pneumonia, unspecified organism: J18.9

## 2017-02-20 LAB — BASIC METABOLIC PANEL
Anion gap: 8 (ref 5–15)
BUN: 9 mg/dL (ref 6–20)
CHLORIDE: 106 mmol/L (ref 101–111)
CO2: 29 mmol/L (ref 22–32)
Calcium: 9.2 mg/dL (ref 8.9–10.3)
Creatinine, Ser: 0.84 mg/dL (ref 0.44–1.00)
GFR calc Af Amer: >60 mL/min (ref >60)
GFR calc non Af Amer: >60 mL/min (ref >60)
Glucose, Bld: 127 mg/dL — ABNORMAL HIGH (ref 65–99)
Potassium: 3.9 mmol/L (ref 3.5–5.1)
SODIUM: 143 mmol/L (ref 135–145)

## 2017-02-20 LAB — CBC
HEMATOCRIT: 39 % (ref 36.0–46.0)
Hemoglobin: 12.6 g/dL (ref 12.0–15.0)
MCH: 29.2 pg (ref 26.0–34.0)
MCHC: 32.3 g/dL (ref 30.0–36.0)
MCV: 90.5 fL (ref 78.0–100.0)
Platelets: 189 10*3/uL (ref 150–400)
RBC: 4.31 MIL/uL (ref 3.87–5.11)
RDW: 13.7 % (ref 11.5–15.5)
WBC: 5 10*3/uL (ref 4.0–10.5)

## 2017-02-20 NOTE — Progress Notes (Signed)
PCp is Dr. Gar Ponto Denies ever seeing a cardiologist.  Denies ever having a card cath, Stress test, or echo.  Denies any cough, fever, or chest pain. Boost with instructions to drink by 0700 given. (ERAS)  Voices understanding.

## 2017-02-21 ENCOUNTER — Ambulatory Visit
Admission: RE | Admit: 2017-02-21 | Discharge: 2017-02-21 | Disposition: A | Discharge status: Home / Self Care | Payer: Medicare Other | Source: Ambulatory Visit | Attending: General Surgery | Admitting: General Surgery

## 2017-02-21 DIAGNOSIS — R928 Other abnormal and inconclusive findings on diagnostic imaging of breast: Secondary | ICD-10-CM | POA: Diagnosis not present

## 2017-02-21 DIAGNOSIS — R921 Mammographic calcification found on diagnostic imaging of breast: Secondary | ICD-10-CM

## 2017-02-22 ENCOUNTER — Ambulatory Visit (HOSPITAL_COMMUNITY): Payer: Medicare Other | Admitting: Anesthesiology

## 2017-02-22 ENCOUNTER — Encounter (HOSPITAL_COMMUNITY): Payer: Self-pay | Admitting: *Deleted

## 2017-02-22 ENCOUNTER — Ambulatory Visit (HOSPITAL_COMMUNITY)
Admission: RE | Admit: 2017-02-22 | Discharge: 2017-02-22 | Disposition: A | Discharge status: Home / Self Care | Payer: Medicare Other | Source: Ambulatory Visit | Attending: General Surgery | Admitting: General Surgery

## 2017-02-22 ENCOUNTER — Ambulatory Visit
Admission: RE | Admit: 2017-02-22 | Discharge: 2017-02-22 | Disposition: A | Discharge status: Home / Self Care | Payer: Medicare Other | Source: Ambulatory Visit | Attending: General Surgery | Admitting: General Surgery

## 2017-02-22 ENCOUNTER — Encounter (HOSPITAL_COMMUNITY)
Admission: RE | Disposition: A | Discharge status: Home / Self Care | Payer: Self-pay | Source: Ambulatory Visit | Attending: General Surgery

## 2017-02-22 DIAGNOSIS — Z87891 Personal history of nicotine dependence: Secondary | ICD-10-CM | POA: Diagnosis not present

## 2017-02-22 DIAGNOSIS — R921 Mammographic calcification found on diagnostic imaging of breast: Secondary | ICD-10-CM

## 2017-02-22 DIAGNOSIS — Z853 Personal history of malignant neoplasm of breast: Secondary | ICD-10-CM | POA: Diagnosis not present

## 2017-02-22 DIAGNOSIS — Z79899 Other long term (current) drug therapy: Secondary | ICD-10-CM | POA: Diagnosis not present

## 2017-02-22 DIAGNOSIS — I10 Essential (primary) hypertension: Secondary | ICD-10-CM | POA: Diagnosis not present

## 2017-02-22 DIAGNOSIS — Z9841 Cataract extraction status, right eye: Secondary | ICD-10-CM | POA: Diagnosis not present

## 2017-02-22 DIAGNOSIS — K759 Inflammatory liver disease, unspecified: Secondary | ICD-10-CM | POA: Diagnosis not present

## 2017-02-22 DIAGNOSIS — D242 Benign neoplasm of left breast: Secondary | ICD-10-CM | POA: Diagnosis not present

## 2017-02-22 DIAGNOSIS — E039 Hypothyroidism, unspecified: Secondary | ICD-10-CM | POA: Insufficient documentation

## 2017-02-22 DIAGNOSIS — D0512 Intraductal carcinoma in situ of left breast: Secondary | ICD-10-CM | POA: Diagnosis not present

## 2017-02-22 DIAGNOSIS — K219 Gastro-esophageal reflux disease without esophagitis: Secondary | ICD-10-CM | POA: Diagnosis not present

## 2017-02-22 HISTORY — PX: BREAST LUMPECTOMY WITH RADIOACTIVE SEED LOCALIZATION: SHX6424

## 2017-02-22 SURGERY — BREAST LUMPECTOMY WITH RADIOACTIVE SEED LOCALIZATION
Anesthesia: General | Site: Breast | Laterality: Left

## 2017-02-22 MED ORDER — DEXAMETHASONE SODIUM PHOSPHATE 10 MG/ML IJ SOLN
INTRAMUSCULAR | Status: DC | PRN
  Administered 2017-02-22: 10 mg via INTRAVENOUS

## 2017-02-22 MED ORDER — GABAPENTIN 300 MG PO CAPS
300.0000 mg | ORAL_CAPSULE | ORAL | Status: AC
  Administered 2017-02-22: 300 mg via ORAL
  Filled 2017-02-22: qty 1

## 2017-02-22 MED ORDER — LIDOCAINE 2% (20 MG/ML) 5 ML SYRINGE
INTRAMUSCULAR | Status: AC
  Filled 2017-02-22: qty 5

## 2017-02-22 MED ORDER — LACTATED RINGERS IV SOLN
INTRAVENOUS | Status: DC
  Administered 2017-02-22: 09:00:00 via INTRAVENOUS

## 2017-02-22 MED ORDER — CHLORHEXIDINE GLUCONATE CLOTH 2 % EX PADS: 6.0000 | MEDICATED_PAD | Freq: Once | CUTANEOUS | Status: DC

## 2017-02-22 MED ORDER — ACETAMINOPHEN 500 MG PO TABS
1000.0000 mg | ORAL_TABLET | ORAL | Status: AC
  Administered 2017-02-22: 1000 mg via ORAL
  Filled 2017-02-22: qty 2

## 2017-02-22 MED ORDER — BUPIVACAINE-EPINEPHRINE 0.25% -1:200000 IJ SOLN
INTRAMUSCULAR | Status: DC | PRN
  Administered 2017-02-22: 19 mL

## 2017-02-22 MED ORDER — CEFAZOLIN SODIUM-DEXTROSE 2-4 GM/100ML-% IV SOLN
2.0000 g | INTRAVENOUS | Status: AC
  Administered 2017-02-22: 2 g via INTRAVENOUS
  Filled 2017-02-22: qty 100

## 2017-02-22 MED ORDER — DEXAMETHASONE SODIUM PHOSPHATE 10 MG/ML IJ SOLN
INTRAMUSCULAR | Status: AC
  Filled 2017-02-22: qty 1

## 2017-02-22 MED ORDER — ONDANSETRON HCL 4 MG/2ML IJ SOLN
INTRAMUSCULAR | Status: AC
  Filled 2017-02-22: qty 2

## 2017-02-22 MED ORDER — EPHEDRINE 5 MG/ML INJ
INTRAVENOUS | Status: AC
  Filled 2017-02-22: qty 10

## 2017-02-22 MED ORDER — CELECOXIB 200 MG PO CAPS
200.0000 mg | ORAL_CAPSULE | ORAL | Status: AC
  Administered 2017-02-22: 200 mg via ORAL
  Filled 2017-02-22: qty 1

## 2017-02-22 MED ORDER — ONDANSETRON HCL 4 MG/2ML IJ SOLN
INTRAMUSCULAR | Status: DC | PRN
  Administered 2017-02-22: 4 mg via INTRAVENOUS

## 2017-02-22 MED ORDER — LIDOCAINE HCL (CARDIAC) 20 MG/ML IV SOLN
INTRAVENOUS | Status: DC | PRN
  Administered 2017-02-22: 100 mg via INTRAVENOUS

## 2017-02-22 MED ORDER — HYDROCODONE-ACETAMINOPHEN 5-325 MG PO TABS: 1.0000 | ORAL_TABLET | ORAL | 0 refills | Status: DC | PRN

## 2017-02-22 MED ORDER — 0.9 % SODIUM CHLORIDE (POUR BTL) OPTIME
TOPICAL | Status: DC | PRN
  Administered 2017-02-22: 1000 mL

## 2017-02-22 MED ORDER — MEPERIDINE HCL 25 MG/ML IJ SOLN: 6.2500 mg | INTRAMUSCULAR | Status: DC | PRN

## 2017-02-22 MED ORDER — FENTANYL CITRATE (PF) 100 MCG/2ML IJ SOLN
INTRAMUSCULAR | Status: DC | PRN
  Administered 2017-02-22: 50 ug via INTRAVENOUS

## 2017-02-22 MED ORDER — EPHEDRINE SULFATE 50 MG/ML IJ SOLN
INTRAMUSCULAR | Status: DC | PRN
  Administered 2017-02-22: 5 mg via INTRAVENOUS
  Administered 2017-02-22: 10 mg via INTRAVENOUS
  Administered 2017-02-22: 5 mg via INTRAVENOUS

## 2017-02-22 MED ORDER — SUGAMMADEX SODIUM 200 MG/2ML IV SOLN
INTRAVENOUS | Status: AC
  Filled 2017-02-22: qty 2

## 2017-02-22 MED ORDER — FENTANYL CITRATE (PF) 100 MCG/2ML IJ SOLN: 25.0000 ug | INTRAMUSCULAR | Status: DC | PRN

## 2017-02-22 MED ORDER — FENTANYL CITRATE (PF) 250 MCG/5ML IJ SOLN
INTRAMUSCULAR | Status: AC
  Filled 2017-02-22: qty 5

## 2017-02-22 MED ORDER — PROPOFOL 10 MG/ML IV BOLUS
INTRAVENOUS | Status: AC
  Filled 2017-02-22: qty 20

## 2017-02-22 MED ORDER — BUPIVACAINE-EPINEPHRINE (PF) 0.25% -1:200000 IJ SOLN
INTRAMUSCULAR | Status: AC
  Filled 2017-02-22: qty 30

## 2017-02-22 MED ORDER — PROPOFOL 10 MG/ML IV BOLUS
INTRAVENOUS | Status: DC | PRN
  Administered 2017-02-22: 100 mg via INTRAVENOUS

## 2017-02-22 SURGICAL SUPPLY — 40 items
ADH SKN CLS APL DERMABOND .7 (GAUZE/BANDAGES/DRESSINGS) ×1
APPLIER CLIP 9.375 MED OPEN (MISCELLANEOUS)
APR CLP MED 9.3 20 MLT OPN (MISCELLANEOUS)
BLADE SURG 15 STRL LF DISP TIS (BLADE) ×1 IMPLANT
BLADE SURG 15 STRL SS (BLADE) ×3
CANISTER SUCT 3000ML PPV (MISCELLANEOUS) ×3 IMPLANT
CHLORAPREP W/TINT 26ML (MISCELLANEOUS) ×3 IMPLANT
CLIP APPLIE 9.375 MED OPEN (MISCELLANEOUS) IMPLANT
COVER PROBE W GEL 5X96 (DRAPES) ×3 IMPLANT
COVER SURGICAL LIGHT HANDLE (MISCELLANEOUS) ×3 IMPLANT
DERMABOND ADVANCED (GAUZE/BANDAGES/DRESSINGS) ×2
DERMABOND ADVANCED .7 DNX12 (GAUZE/BANDAGES/DRESSINGS) ×1 IMPLANT
DEVICE DUBIN SPECIMEN MAMMOGRA (MISCELLANEOUS) ×3 IMPLANT
DRAPE CHEST BREAST 15X10 FENES (DRAPES) ×3 IMPLANT
DRAPE UTILITY XL STRL (DRAPES) ×3 IMPLANT
ELECT COATED BLADE 2.86 ST (ELECTRODE) ×3 IMPLANT
ELECT REM PT RETURN 9FT ADLT (ELECTROSURGICAL) ×3
ELECTRODE REM PT RTRN 9FT ADLT (ELECTROSURGICAL) ×1 IMPLANT
GLOVE BIO SURGEON STRL SZ7.5 (GLOVE) ×6 IMPLANT
GOWN STRL REUS W/ TWL LRG LVL3 (GOWN DISPOSABLE) ×2 IMPLANT
GOWN STRL REUS W/TWL LRG LVL3 (GOWN DISPOSABLE) ×6
KIT BASIN OR (CUSTOM PROCEDURE TRAY) ×3 IMPLANT
KIT MARKER MARGIN INK (KITS) ×3 IMPLANT
LIGHT WAVEGUIDE WIDE FLAT (MISCELLANEOUS) IMPLANT
NDL HYPO 25GX1X1/2 BEV (NEEDLE) ×1 IMPLANT
NEEDLE HYPO 25GX1X1/2 BEV (NEEDLE) ×3 IMPLANT
NS IRRIG 1000ML POUR BTL (IV SOLUTION) ×3 IMPLANT
PACK SURGICAL SETUP 50X90 (CUSTOM PROCEDURE TRAY) ×3 IMPLANT
PENCIL BUTTON HOLSTER BLD 10FT (ELECTRODE) ×3 IMPLANT
SPONGE LAP 18X18 X RAY DECT (DISPOSABLE) ×3 IMPLANT
SUT MNCRL AB 4-0 PS2 18 (SUTURE) ×3 IMPLANT
SUT SILK 2 0 SH (SUTURE) IMPLANT
SUT VIC AB 3-0 SH 18 (SUTURE) ×3 IMPLANT
SYR BULB 3OZ (MISCELLANEOUS) ×3 IMPLANT
SYR CONTROL 10ML LL (SYRINGE) ×3 IMPLANT
TOWEL OR 17X24 6PK STRL BLUE (TOWEL DISPOSABLE) ×3 IMPLANT
TOWEL OR 17X26 10 PK STRL BLUE (TOWEL DISPOSABLE) ×3 IMPLANT
TUBE CONNECTING 12'X1/4 (SUCTIONS) ×1
TUBE CONNECTING 12X1/4 (SUCTIONS) ×2 IMPLANT
YANKAUER SUCT BULB TIP NO VENT (SUCTIONS) ×3 IMPLANT

## 2017-02-22 NOTE — Transfer of Care (Signed)
Immediate Anesthesia Transfer of Care Note  Patient: Dorothy Glenn  Procedure(s) Performed: BREAST LUMPECTOMY WITH RADIOACTIVE SEED LOCALIZATION X'S 2 (Left Breast)  Patient Location: PACU  Anesthesia Type:General  Level of Consciousness: oriented, drowsy and patient cooperative  Airway & Oxygen Therapy: Patient Spontanous Breathing and Patient connected to nasal cannula oxygen  Post-op Assessment: Report given to RN and Post -op Vital signs reviewed and stable  Post vital signs: Reviewed  Last Vitals:  Vitals:   02/22/17 0855 02/22/17 1241  BP: (!) 189/79   Pulse: 75   Resp: 20   Temp: 36.6 C (P) 36.5 C  SpO2: 96%     Last Pain:  Vitals:   02/22/17 1241  TempSrc:   PainSc: (P) 0-No pain         Complications: No apparent anesthesia complications

## 2017-02-22 NOTE — Anesthesia Postprocedure Evaluation (Signed)
Anesthesia Post Note  Patient: Dorothy Glenn  Procedure(s) Performed: BREAST LUMPECTOMY WITH RADIOACTIVE SEED LOCALIZATION X'S 2 (Left Breast)     Patient location during evaluation: PACU Anesthesia Type: General Level of consciousness: awake and alert Pain management: pain level controlled Vital Signs Assessment: post-procedure vital signs reviewed and stable Respiratory status: spontaneous breathing, nonlabored ventilation, respiratory function stable and patient connected to nasal cannula oxygen Cardiovascular status: blood pressure returned to baseline and stable Postop Assessment: no apparent nausea or vomiting Anesthetic complications: no    Last Vitals:  Vitals:   02/22/17 1255 02/22/17 1310  BP: (!) 144/61 (!) 148/63  Pulse: 73 74  Resp: 11 15  Temp:  36.5 C  SpO2: 93% 95%    Last Pain:  Vitals:   02/22/17 1310  TempSrc:   PainSc: 0-No pain                 Fumie Fiallo

## 2017-02-22 NOTE — H&P (Signed)
Dorothy Glenn  Location: [facility] Patient #: [id number] DOB: 13-Mar-1929 Widowed / Language: Cleophus Molt / Race: White Female   History of Present Illness  The patient is a 83 year old female who presents with a breast mass. We are asked to see the patient in consultation by Dr. Remer Macho to evaluate her for abnormal left breast calcification. The patient is an 83 year old white female who has a remote history of left breast cancer about 15-20 years ago that was treated with lumpectomy. Unfortunately she did not receive any radiation therapy or any chemotherapy. She was unable to tolerate antiestrogen therapy. She has been otherwise well. On her recent mammogram she was found to have 2 areas of abnormal calcification near her previous lumpectomy site. Both of these were biopsied and came back benign but there appearance is suspicious.   Past Surgical History Appendectomy  Breast Biopsy  Bilateral. Breast Mass; Local Excision  Left. Cataract Surgery  Right. Colon Polyp Removal - Colonoscopy  Colon Polyp Removal - Open  Hemorrhoidectomy  Hysterectomy (due to cancer) - Complete  Hysterectomy (not due to cancer) - Complete  Knee Surgery  Right. Open Inguinal Hernia Surgery  Right. Spinal Surgery - Lower Back  Tonsillectomy   Diagnostic Studies History Colonoscopy  >10 years ago Mammogram  within last year Pap Smear  >5 years ago  Allergies Demerol *ANALGESICS - OPIOID*  Hydrocodone-Acetaminophen *ANALGESICS - OPIOID*  Allergies Reconciled   Medication History Acetaminophen (650MG  Tablet, Oral) Active. Amitriptyline HCl (10MG  Tablet, Oral) Active. Levothyroxine Sodium (50MCG Tablet, Oral) Active. Claritin (10MG  Tablet, Oral) Active. Toprol XL (100MG  Tablet ER 24HR, Oral) Active. Medications Reconciled  Social History  Caffeine use  Tea. No alcohol use  No drug use  Tobacco use  Former smoker.  Family History Breast  Cancer  Mother, Sister. Cerebrovascular Accident  Mother. Heart Disease  Sister. Heart disease in female family member before age 50  Migraine Headache  Daughter, Son. Prostate Cancer  Father. Respiratory Condition  Father.  Pregnancy / Birth History Age at menarche  74 years. Age of menopause  <47 Gravida  2 Length (months) of breastfeeding  7-12 Maternal age  50-25 Para  2  Other Problems  Arthritis  Back Pain  Breast Cancer  Cancer  Diverticulosis  Gastroesophageal Reflux Disease  Hemorrhoids  Hepatitis  Inguinal Hernia  Lump In Breast  Migraine Headache  Oophorectomy  Bilateral. Other disease, cancer, significant illness  Thyroid Disease     Review of Systems General Present- Fatigue and Weight Gain. Not Present- Appetite Loss, Chills, Fever, Night Sweats and Weight Loss. Skin Present- Change in Wart/Mole and Dryness. Not Present- Hives, Jaundice, New Lesions, Non-Healing Wounds, Rash and Ulcer. HEENT Present- Earache, Hearing Loss, Ringing in the Ears, Seasonal Allergies, Visual Disturbances and Wears glasses/contact lenses. Not Present- Hoarseness, Nose Bleed, Oral Ulcers, Sinus Pain, Sore Throat and Yellow Eyes. Respiratory Not Present- Bloody sputum, Chronic Cough, Difficulty Breathing, Snoring and Wheezing. Breast Not Present- Breast Mass, Breast Pain, Nipple Discharge and Skin Changes. Cardiovascular Not Present- Chest Pain, Difficulty Breathing Lying Down, Leg Cramps, Palpitations, Rapid Heart Rate, Shortness of Breath and Swelling of Extremities. Gastrointestinal Present- Abdominal Pain, Bloating, Change in Bowel Habits, Chronic diarrhea and Constipation. Not Present- Bloody Stool, Difficulty Swallowing, Excessive gas, Gets full quickly at meals, Hemorrhoids, Indigestion, Nausea, Rectal Pain and Vomiting. Female Genitourinary Not Present- Frequency, Nocturia, Painful Urination, Pelvic Pain and Urgency. Musculoskeletal Present- Back  Pain and Joint Pain. Not Present- Joint Stiffness, Muscle Pain, Muscle  Weakness and Swelling of Extremities. Neurological Not Present- Decreased Memory, Fainting, Headaches, Numbness, Seizures, Tingling, Tremor, Trouble walking and Weakness. Psychiatric Not Present- Anxiety, Bipolar, Change in Sleep Pattern, Depression, Fearful and Frequent crying. Endocrine Not Present- Cold Intolerance, Excessive Hunger, Hair Changes, Heat Intolerance, Hot flashes and New Diabetes. Hematology Not Present- Blood Thinners, Easy Bruising, Excessive bleeding, Gland problems, HIV and Persistent Infections.  Vitals Weight: 200.25 lb Height: 65in Body Surface Area: 1.98 m Body Mass Index: 33.32 kg/m  Temp.: 82F(Oral)  BP: 142/92 (Sitting, Left Arm, Standard)       Physical Exam General Mental Status-Alert. General Appearance-Consistent with stated age. Hydration-Well hydrated. Voice-Normal.  Head and Neck Head-normocephalic, atraumatic with no lesions or palpable masses. Trachea-midline. Thyroid Gland Characteristics - normal size and consistency.  Eye Eyeball - Bilateral-Extraocular movements intact. Sclera/Conjunctiva - Bilateral-No scleral icterus.  Chest and Lung Exam Chest and lung exam reveals -quiet, even and easy respiratory effort with no use of accessory muscles and on auscultation, normal breath sounds, no adventitious sounds and normal vocal resonance. Inspection Chest Wall - Normal. Back - normal.  Breast Note: There are 2 well-healed lumpectomy incisions on the left breast, one on the upper and one on the lower breast. There is no palpable mass in either breast. There is no palpable axillary, supraclavicular, or cervical lymphadenopathy.   Cardiovascular Cardiovascular examination reveals -normal heart sounds, regular rate and rhythm with no murmurs and normal pedal pulses bilaterally.  Abdomen Inspection Inspection of the abdomen reveals -  No Hernias. Skin - Scar - no surgical scars. Palpation/Percussion Palpation and Percussion of the abdomen reveal - Soft, Non Tender, No Rebound tenderness, No Rigidity (guarding) and No hepatosplenomegaly. Auscultation Auscultation of the abdomen reveals - Bowel sounds normal.  Neurologic Neurologic evaluation reveals -alert and oriented x 3 with no impairment of recent or remote memory. Mental Status-Normal.  Musculoskeletal Normal Exam - Left-Upper Extremity Strength Normal and Lower Extremity Strength Normal. Normal Exam - Right-Upper Extremity Strength Normal and Lower Extremity Strength Normal.  Lymphatic Head & Neck  General Head & Neck Lymphatics: Bilateral - Description - Normal. Axillary  General Axillary Region: Bilateral - Description - Normal. Tenderness - Non Tender. Femoral & Inguinal  Generalized Femoral & Inguinal Lymphatics: Bilateral - Description - Normal. Tenderness - Non Tender.    Assessment & Plan BREAST CALCIFICATION, LEFT (R92.1) Impression: The patient was recently found to have some abnormal-appearing calcification in the left breast near a previous lumpectomy site that was done for breast cancer remotely. Because of her history and because of the abnormal appearance of these calcifications I think it would be reasonable to excise these areas to make sure that we are not missing a small recurrent cancer especially since she was not able to be treated with radiation or anti-estrogens after her previous diagnosis. This would require 2 radioactive seeds for localization. I have discussed with her in detail the risks and benefits of the operation as well as some of the technical aspects and she understands and wishes to proceed

## 2017-02-22 NOTE — Op Note (Signed)
02/22/2017  12:33 PM  PATIENT:  Dorothy Glenn  83 y.o. female  PRE-OPERATIVE DIAGNOSIS:  LEFT BREAST CALCIFICATIONS  POST-OPERATIVE DIAGNOSIS:  LEFT BREAST CALCIFICATIONS  PROCEDURE:  Procedure(s): BREAST LUMPECTOMY WITH RADIOACTIVE SEED LOCALIZATION X'S 2 (Left)  SURGEON:  Surgeon(s) and Role:    Jovita Kussmaul, MD - Primary  PHYSICIAN ASSISTANT:   ASSISTANTS: none   ANESTHESIA:   local and general  EBL:  No intake/output data recorded.  BLOOD ADMINISTERED:none  DRAINS: none   LOCAL MEDICATIONS USED:  MARCAINE     SPECIMEN:  Source of Specimen:  left breast tissue with additional deep margin  DISPOSITION OF SPECIMEN:  PATHOLOGY  COUNTS:  YES  TOURNIQUET:  * No tourniquets in log *  DICTATION: .Dragon Dictation   After informed consent was obtained the patient was brought to the operating room and placed in the supine position on the operating table. After adequate induction of general anesthesia the patient's left breast was prepped with ChloraPrep, allowed to dry, and draped in usual sterile manner. An appropriate timeout was performed. Previously 2 I-125 seeds were placed in the central left breast to bracket an area of abnormal calcification. The neoprobe was sent to I-125 in the area of radioactivity was readily identified. A curvilinear incision was made overlying the area of radioactivity with a 15 blade knife. Incision was carried through the skin and subcutaneous tissue sharply with the electrocautery. While checking the area of radioactivity frequently a circular portion of breast tissue was excised sharply with electrocautery around the 2 radioactive seeds. The deep seed was found free at the bottom of the specimen. It was sent separately for identification purposes and additional deep margin down to the chest wall was then removed as well and sent to pathology. A specimen radiograph was obtained that showed the remaining seed and clips to be within the specimen. The  specimen was then marked with the appropriate paint colors. Hemostasis was achieved using the Bovie electrocautery. The wound was infiltrated with quarter percent Marcaine and irrigated with saline. The deep layer of the wound was then closed with layers of interrupted 3-0 Vicryl stitches. The skin was then closed with a running 4-0 Monocryl subcuticular stitch. Dermabond dressings were applied. The patient tolerated the procedure well. At the end of the case all needle sponge and instrument counts were correct. The patient was then awakened and taken to recovery in stable condition.  PLAN OF CARE: Discharge to home after PACU  PATIENT DISPOSITION:  PACU - hemodynamically stable.   Delay start of Pharmacological VTE agent (>24hrs) due to surgical blood loss or risk of bleeding: not applicable

## 2017-02-22 NOTE — Interval H&P Note (Signed)
History and Physical Interval Note:  02/22/2017 11:20 AM  Dorothy Glenn  has presented today for surgery, with the diagnosis of LEFT BREAST CALCIFICATIONS  The various methods of treatment have been discussed with the patient and family. After consideration of risks, benefits and other options for treatment, the patient has consented to  Procedure(s): BREAST LUMPECTOMY WITH RADIOACTIVE SEED LOCALIZATION X'S 2 (Left) as a surgical intervention .  The patient's history has been reviewed, patient examined, no change in status, stable for surgery.  I have reviewed the patient's chart and labs.  Questions were answered to the patient's satisfaction.     TOTH III,PAUL S

## 2017-02-22 NOTE — Anesthesia Preprocedure Evaluation (Addendum)
Anesthesia Evaluation  Patient identified by MRN, date of birth, ID band Patient awake    Reviewed: Allergy & Precautions, H&P , NPO status , Patient's Chart, lab work & pertinent test results  History of Anesthesia Complications Negative for: history of anesthetic complications  Airway Mallampati: III  TM Distance: <3 FB Neck ROM: Full  Mouth opening: Limited Mouth Opening  Dental no notable dental hx. (+) Dental Advisory Given, Poor Dentition, Partial Upper, Partial Lower, Missing   Pulmonary neg pulmonary ROS, former smoker,    Pulmonary exam normal breath sounds clear to auscultation       Cardiovascular hypertension, Pt. on home beta blockers negative cardio ROS Normal cardiovascular exam Rhythm:Regular Rate:Normal     Neuro/Psych negative neurological ROS  negative psych ROS   GI/Hepatic negative GI ROS, GERD  Medicated,(+) Hepatitis -, A  Endo/Other  Hypothyroidism   Renal/GU negative Renal ROS     Musculoskeletal negative musculoskeletal ROS (+)   Abdominal   Peds  Hematology negative hematology ROS (+)   Anesthesia Other Findings ANTERIOR BY EXAM  Reproductive/Obstetrics                            Anesthesia Physical  Anesthesia Plan  ASA: III  Anesthesia Plan: General   Post-op Pain Management:    Induction: Intravenous  PONV Risk Score and Plan: 2 and Ondansetron, Dexamethasone, Treatment may vary due to age or medical condition and Midazolam  Airway Management Planned: LMA  Additional Equipment:   Intra-op Plan:   Post-operative Plan: Extubation in OR  Informed Consent: I have reviewed the patients History and Physical, chart, labs and discussed the procedure including the risks, benefits and alternatives for the proposed anesthesia with the patient or authorized representative who has indicated his/her understanding and acceptance.   Dental advisory  given  Plan Discussed with: CRNA  Anesthesia Plan Comments: (  )       Anesthesia Quick Evaluation                                   Anesthesia Evaluation  Patient identified by MRN, date of birth, ID band Patient awake    Reviewed: Allergy & Precautions, H&P , NPO status , Patient's Chart, lab work & pertinent test results  History of Anesthesia Complications (+) PONV and history of anesthetic complications  Airway       Dental  (+) Dental Advisory Given   Pulmonary neg pulmonary ROS,          Cardiovascular hypertension, Pt. on home beta blockers negative cardio ROS      Neuro/Psych negative neurological ROS  negative psych ROS   GI/Hepatic negative GI ROS, GERD-  Medicated,(+) Hepatitis -, A  Endo/Other  Hypothyroidism   Renal/GU negative Renal ROS     Musculoskeletal negative musculoskeletal ROS (+)   Abdominal   Peds  Hematology negative hematology ROS (+)   Anesthesia Other Findings   Reproductive/Obstetrics                           Anesthesia Physical Anesthesia Plan  ASA: III  Anesthesia Plan: Spinal   Post-op Pain Management:    Induction:   Airway Management Planned:   Additional Equipment:   Intra-op Plan:   Post-operative Plan:   Informed Consent: I have reviewed the patients History and Physical, chart,  labs and discussed the procedure including the risks, benefits and alternatives for the proposed anesthesia with the patient or authorized representative who has indicated his/her understanding and acceptance.   Dental advisory given  Plan Discussed with: CRNA  Anesthesia Plan Comments:         Anesthesia Quick Evaluation

## 2017-02-22 NOTE — Anesthesia Procedure Notes (Signed)
Procedure Name: LMA Insertion Date/Time: 02/22/2017 11:40 AM Performed by: Luciana Axe K Pre-anesthesia Checklist: Patient identified, Emergency Drugs available, Suction available and Patient being monitored Patient Re-evaluated:Patient Re-evaluated prior to induction Oxygen Delivery Method: Circle System Utilized Preoxygenation: Pre-oxygenation with 100% oxygen Induction Type: IV induction Ventilation: Mask ventilation without difficulty LMA: LMA inserted LMA Size: 4.0 Number of attempts: 1 Airway Equipment and Method: Bite block Placement Confirmation: positive ETCO2 and breath sounds checked- equal and bilateral Tube secured with: Tape Dental Injury: Teeth and Oropharynx as per pre-operative assessment

## 2017-02-23 ENCOUNTER — Encounter (HOSPITAL_COMMUNITY): Payer: Self-pay | Admitting: General Surgery

## 2017-02-27 DIAGNOSIS — E782 Mixed hyperlipidemia: Secondary | ICD-10-CM | POA: Diagnosis not present

## 2017-02-27 DIAGNOSIS — I10 Essential (primary) hypertension: Secondary | ICD-10-CM | POA: Diagnosis not present

## 2017-02-27 DIAGNOSIS — Z6831 Body mass index (BMI) 31.0-31.9, adult: Secondary | ICD-10-CM | POA: Diagnosis not present

## 2017-02-27 DIAGNOSIS — E039 Hypothyroidism, unspecified: Secondary | ICD-10-CM | POA: Diagnosis not present

## 2017-02-27 DIAGNOSIS — Z23 Encounter for immunization: Secondary | ICD-10-CM | POA: Diagnosis not present

## 2017-02-27 DIAGNOSIS — I739 Peripheral vascular disease, unspecified: Secondary | ICD-10-CM | POA: Diagnosis not present

## 2017-02-27 DIAGNOSIS — M170 Bilateral primary osteoarthritis of knee: Secondary | ICD-10-CM | POA: Diagnosis not present

## 2017-02-27 DIAGNOSIS — K580 Irritable bowel syndrome with diarrhea: Secondary | ICD-10-CM | POA: Diagnosis not present

## 2017-03-30 ENCOUNTER — Other Ambulatory Visit: Payer: Self-pay

## 2017-03-30 ENCOUNTER — Encounter (HOSPITAL_COMMUNITY): Payer: Self-pay | Admitting: Oncology

## 2017-03-30 ENCOUNTER — Encounter (HOSPITAL_COMMUNITY): Payer: Medicare Other | Attending: Oncology | Admitting: Oncology

## 2017-03-30 VITALS — BP 185/76 | HR 67 | Temp 97.7°F | Resp 18 | Wt 199.9 lb

## 2017-03-30 DIAGNOSIS — M170 Bilateral primary osteoarthritis of knee: Secondary | ICD-10-CM | POA: Diagnosis not present

## 2017-03-30 DIAGNOSIS — K589 Irritable bowel syndrome without diarrhea: Secondary | ICD-10-CM

## 2017-03-30 DIAGNOSIS — D0512 Intraductal carcinoma in situ of left breast: Secondary | ICD-10-CM
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
## 2017-03-30 NOTE — Patient Instructions (Signed)
[facility] Discharge Instructions  RECOMMENDATIONS MADE BY THE CONSULTANT AND ANY TEST RESULTS WILL BE SENT TO YOUR REFERRING PHYSICIAN.  You were seen today by Dr. Louise Zhou Follow up in 6 months   Thank you for choosing [facility] at [facility] to provide your oncology and hematology care.  To afford each patient quality time with our provider, please arrive at least 15 minutes before your scheduled appointment time.    If you have a lab appointment with the Cancer Center please come in thru the  Main Entrance and check in at the main information desk  You need to re-schedule your appointment should you arrive 10 or more minutes late.  We strive to give you quality time with our providers, and arriving late affects you and other patients whose appointments are after yours.  Also, if you no show three or more times for appointments you may be dismissed from the clinic at the providers discretion.     Again, thank you for choosing [facility].  Our hope is that these requests will decrease the amount of time that you wait before being seen by our physicians.       _____________________________________________________________  Should you have questions after your visit to [facility], please contact our office at [phone] between the hours of 8:30 a.m. and 4:30 p.m.  Voicemails left after 4:30 p.m. will not be returned until the following business day.  For prescription refill requests, have your pharmacy contact our office.       Resources For Cancer Patients and their Caregivers ? American Cancer Society: Can assist with transportation, wigs, general needs, runs Look Good Feel Better.        1-888-227-6333 ? Cancer Care: Provides financial assistance, online support groups, medication/co-pay assistance.  1-800-813-HOPE (4673) ? [facility] Assists [place]  cancer patients and their families through emotional , educational and financial support.  336-427-4357 ? [place] DSS Where to apply for food stamps, Medicaid and utility assistance. 336-342-1394 ? [facility]: Transportation to medical appointments. 336-347-2287 ? Social Security Administration: May apply for disability if have a Stage IV cancer. 336-342-7796 1-800-772-1213 ? [place] Aging, Disability and Transit Services: Assists with nutrition, care and transit needs. 336-349-2343  Cancer Center Support Programs: @10RELATIVEDAYS@ > Cancer Support Group  2nd Tuesday of the month 1pm-2pm, Journey Room  > Creative Journey  3rd Tuesday of the month 1130am-1pm, Journey Room  > Look Good Feel Better  1st Wednesday of the month 10am-12 noon, Journey Room (Call American Cancer Society to register 1-800-395-5775)    

## 2017-04-17 DIAGNOSIS — L821 Other seborrheic keratosis: Secondary | ICD-10-CM | POA: Diagnosis not present

## 2017-04-17 DIAGNOSIS — D229 Melanocytic nevi, unspecified: Secondary | ICD-10-CM | POA: Diagnosis not present

## 2017-06-27 DIAGNOSIS — K580 Irritable bowel syndrome with diarrhea: Secondary | ICD-10-CM | POA: Diagnosis not present

## 2017-06-27 DIAGNOSIS — E039 Hypothyroidism, unspecified: Secondary | ICD-10-CM | POA: Diagnosis not present

## 2017-06-27 DIAGNOSIS — Z0001 Encounter for general adult medical examination with abnormal findings: Secondary | ICD-10-CM | POA: Diagnosis not present

## 2017-06-27 DIAGNOSIS — I739 Peripheral vascular disease, unspecified: Secondary | ICD-10-CM | POA: Diagnosis not present

## 2017-06-27 DIAGNOSIS — J301 Allergic rhinitis due to pollen: Secondary | ICD-10-CM | POA: Diagnosis not present

## 2017-06-27 DIAGNOSIS — M170 Bilateral primary osteoarthritis of knee: Secondary | ICD-10-CM | POA: Diagnosis not present

## 2017-06-27 DIAGNOSIS — I10 Essential (primary) hypertension: Secondary | ICD-10-CM | POA: Diagnosis not present

## 2017-06-27 DIAGNOSIS — E782 Mixed hyperlipidemia: Secondary | ICD-10-CM | POA: Diagnosis not present

## 2017-06-27 DIAGNOSIS — Z23 Encounter for immunization: Secondary | ICD-10-CM | POA: Diagnosis not present

## 2017-06-27 DIAGNOSIS — Z6831 Body mass index (BMI) 31.0-31.9, adult: Secondary | ICD-10-CM | POA: Diagnosis not present

## 2017-06-27 DIAGNOSIS — K219 Gastro-esophageal reflux disease without esophagitis: Secondary | ICD-10-CM | POA: Diagnosis not present

## 2017-07-04 DIAGNOSIS — H2512 Age-related nuclear cataract, left eye: Secondary | ICD-10-CM | POA: Diagnosis not present

## 2017-07-10 DIAGNOSIS — H2513 Age-related nuclear cataract, bilateral: Secondary | ICD-10-CM | POA: Diagnosis not present

## 2017-08-11 DIAGNOSIS — H2512 Age-related nuclear cataract, left eye: Secondary | ICD-10-CM | POA: Diagnosis not present

## 2017-09-12 DIAGNOSIS — D0512 Intraductal carcinoma in situ of left breast: Secondary | ICD-10-CM | POA: Diagnosis not present

## 2017-09-26 ENCOUNTER — Ambulatory Visit (HOSPITAL_COMMUNITY): Payer: Medicare Other | Admitting: Hematology

## 2017-11-20 DIAGNOSIS — Z9189 Other specified personal risk factors, not elsewhere classified: Secondary | ICD-10-CM | POA: Diagnosis not present

## 2017-11-20 DIAGNOSIS — E782 Mixed hyperlipidemia: Secondary | ICD-10-CM | POA: Diagnosis not present

## 2017-11-20 DIAGNOSIS — K5732 Diverticulitis of large intestine without perforation or abscess without bleeding: Secondary | ICD-10-CM | POA: Diagnosis not present

## 2017-11-20 DIAGNOSIS — E039 Hypothyroidism, unspecified: Secondary | ICD-10-CM | POA: Diagnosis not present

## 2017-11-20 DIAGNOSIS — I10 Essential (primary) hypertension: Secondary | ICD-10-CM | POA: Diagnosis not present

## 2017-11-20 DIAGNOSIS — K219 Gastro-esophageal reflux disease without esophagitis: Secondary | ICD-10-CM | POA: Diagnosis not present

## 2017-11-22 DIAGNOSIS — K580 Irritable bowel syndrome with diarrhea: Secondary | ICD-10-CM | POA: Diagnosis not present

## 2017-11-22 DIAGNOSIS — M170 Bilateral primary osteoarthritis of knee: Secondary | ICD-10-CM | POA: Diagnosis not present

## 2017-11-22 DIAGNOSIS — Z23 Encounter for immunization: Secondary | ICD-10-CM | POA: Diagnosis not present

## 2017-11-22 DIAGNOSIS — E782 Mixed hyperlipidemia: Secondary | ICD-10-CM | POA: Diagnosis not present

## 2017-11-22 DIAGNOSIS — I739 Peripheral vascular disease, unspecified: Secondary | ICD-10-CM | POA: Diagnosis not present

## 2017-11-22 DIAGNOSIS — Z6830 Body mass index (BMI) 30.0-30.9, adult: Secondary | ICD-10-CM | POA: Diagnosis not present

## 2017-11-22 DIAGNOSIS — E039 Hypothyroidism, unspecified: Secondary | ICD-10-CM | POA: Diagnosis not present

## 2017-11-22 DIAGNOSIS — I10 Essential (primary) hypertension: Secondary | ICD-10-CM | POA: Diagnosis not present

## 2017-12-16 DIAGNOSIS — J301 Allergic rhinitis due to pollen: Secondary | ICD-10-CM | POA: Diagnosis not present

## 2017-12-16 DIAGNOSIS — Z6830 Body mass index (BMI) 30.0-30.9, adult: Secondary | ICD-10-CM | POA: Diagnosis not present

## 2017-12-16 DIAGNOSIS — I10 Essential (primary) hypertension: Secondary | ICD-10-CM | POA: Diagnosis not present

## 2017-12-16 DIAGNOSIS — E039 Hypothyroidism, unspecified: Secondary | ICD-10-CM | POA: Diagnosis not present

## 2017-12-16 DIAGNOSIS — J019 Acute sinusitis, unspecified: Secondary | ICD-10-CM | POA: Diagnosis not present

## 2017-12-20 DIAGNOSIS — Z96651 Presence of right artificial knee joint: Secondary | ICD-10-CM | POA: Diagnosis not present

## 2017-12-20 DIAGNOSIS — Z471 Aftercare following joint replacement surgery: Secondary | ICD-10-CM | POA: Diagnosis not present

## 2017-12-20 DIAGNOSIS — M1712 Unilateral primary osteoarthritis, left knee: Secondary | ICD-10-CM | POA: Diagnosis not present

## 2017-12-20 DIAGNOSIS — M1711 Unilateral primary osteoarthritis, right knee: Secondary | ICD-10-CM | POA: Diagnosis not present

## 2018-02-15 DIAGNOSIS — D0512 Intraductal carcinoma in situ of left breast: Secondary | ICD-10-CM | POA: Diagnosis not present

## 2018-02-16 ENCOUNTER — Other Ambulatory Visit: Payer: Self-pay | Admitting: General Surgery

## 2018-02-16 DIAGNOSIS — Z853 Personal history of malignant neoplasm of breast: Secondary | ICD-10-CM

## 2018-02-22 ENCOUNTER — Ambulatory Visit
Admission: RE | Admit: 2018-02-22 | Discharge: 2018-02-22 | Disposition: A | Discharge status: Home / Self Care | Payer: Medicare Other | Source: Ambulatory Visit | Attending: General Surgery | Admitting: General Surgery

## 2018-02-22 DIAGNOSIS — Z853 Personal history of malignant neoplasm of breast: Secondary | ICD-10-CM

## 2018-02-22 DIAGNOSIS — R921 Mammographic calcification found on diagnostic imaging of breast: Secondary | ICD-10-CM | POA: Diagnosis not present

## 2018-03-08 DIAGNOSIS — Z23 Encounter for immunization: Secondary | ICD-10-CM | POA: Diagnosis not present

## 2018-03-27 DIAGNOSIS — Z23 Encounter for immunization: Secondary | ICD-10-CM | POA: Diagnosis not present

## 2018-03-27 DIAGNOSIS — K580 Irritable bowel syndrome with diarrhea: Secondary | ICD-10-CM | POA: Diagnosis not present

## 2018-03-27 DIAGNOSIS — Z6830 Body mass index (BMI) 30.0-30.9, adult: Secondary | ICD-10-CM | POA: Diagnosis not present

## 2018-03-27 DIAGNOSIS — J301 Allergic rhinitis due to pollen: Secondary | ICD-10-CM | POA: Diagnosis not present

## 2018-03-27 DIAGNOSIS — K219 Gastro-esophageal reflux disease without esophagitis: Secondary | ICD-10-CM | POA: Diagnosis not present

## 2018-03-27 DIAGNOSIS — I10 Essential (primary) hypertension: Secondary | ICD-10-CM | POA: Diagnosis not present

## 2018-03-27 DIAGNOSIS — E039 Hypothyroidism, unspecified: Secondary | ICD-10-CM | POA: Diagnosis not present

## 2018-03-27 DIAGNOSIS — I739 Peripheral vascular disease, unspecified: Secondary | ICD-10-CM | POA: Diagnosis not present

## 2018-03-27 DIAGNOSIS — M170 Bilateral primary osteoarthritis of knee: Secondary | ICD-10-CM | POA: Diagnosis not present

## 2018-03-27 DIAGNOSIS — E782 Mixed hyperlipidemia: Secondary | ICD-10-CM | POA: Diagnosis not present

## 2018-04-05 DIAGNOSIS — M1712 Unilateral primary osteoarthritis, left knee: Secondary | ICD-10-CM | POA: Diagnosis not present

## 2018-06-27 DIAGNOSIS — M1712 Unilateral primary osteoarthritis, left knee: Secondary | ICD-10-CM | POA: Diagnosis not present

## 2018-08-03 DIAGNOSIS — I10 Essential (primary) hypertension: Secondary | ICD-10-CM | POA: Diagnosis not present

## 2018-08-03 DIAGNOSIS — Z0001 Encounter for general adult medical examination with abnormal findings: Secondary | ICD-10-CM | POA: Diagnosis not present

## 2018-08-03 DIAGNOSIS — Z6830 Body mass index (BMI) 30.0-30.9, adult: Secondary | ICD-10-CM | POA: Diagnosis not present

## 2018-11-09 DIAGNOSIS — Z6831 Body mass index (BMI) 31.0-31.9, adult: Secondary | ICD-10-CM | POA: Diagnosis not present

## 2018-11-09 DIAGNOSIS — M546 Pain in thoracic spine: Secondary | ICD-10-CM | POA: Diagnosis not present

## 2018-11-21 DIAGNOSIS — H35341 Macular cyst, hole, or pseudohole, right eye: Secondary | ICD-10-CM | POA: Diagnosis not present

## 2018-11-21 DIAGNOSIS — H04123 Dry eye syndrome of bilateral lacrimal glands: Secondary | ICD-10-CM | POA: Diagnosis not present

## 2019-01-15 ENCOUNTER — Other Ambulatory Visit: Payer: Self-pay | Admitting: General Surgery

## 2019-01-15 DIAGNOSIS — Z853 Personal history of malignant neoplasm of breast: Secondary | ICD-10-CM

## 2019-02-19 DIAGNOSIS — Z6830 Body mass index (BMI) 30.0-30.9, adult: Secondary | ICD-10-CM | POA: Diagnosis not present

## 2019-02-19 DIAGNOSIS — E782 Mixed hyperlipidemia: Secondary | ICD-10-CM | POA: Diagnosis not present

## 2019-02-19 DIAGNOSIS — K219 Gastro-esophageal reflux disease without esophagitis: Secondary | ICD-10-CM | POA: Diagnosis not present

## 2019-02-19 DIAGNOSIS — Z23 Encounter for immunization: Secondary | ICD-10-CM | POA: Diagnosis not present

## 2019-02-19 DIAGNOSIS — K580 Irritable bowel syndrome with diarrhea: Secondary | ICD-10-CM | POA: Diagnosis not present

## 2019-02-19 DIAGNOSIS — E039 Hypothyroidism, unspecified: Secondary | ICD-10-CM | POA: Diagnosis not present

## 2019-02-19 DIAGNOSIS — I10 Essential (primary) hypertension: Secondary | ICD-10-CM | POA: Diagnosis not present

## 2019-02-19 DIAGNOSIS — M170 Bilateral primary osteoarthritis of knee: Secondary | ICD-10-CM | POA: Diagnosis not present

## 2019-02-19 DIAGNOSIS — J301 Allergic rhinitis due to pollen: Secondary | ICD-10-CM | POA: Diagnosis not present

## 2019-02-19 DIAGNOSIS — I739 Peripheral vascular disease, unspecified: Secondary | ICD-10-CM | POA: Diagnosis not present

## 2019-02-25 ENCOUNTER — Ambulatory Visit
Admission: RE | Admit: 2019-02-25 | Discharge: 2019-02-25 | Disposition: A | Discharge status: Home / Self Care | Payer: Medicare Other | Source: Ambulatory Visit | Attending: General Surgery | Admitting: General Surgery

## 2019-02-25 ENCOUNTER — Other Ambulatory Visit: Payer: Self-pay

## 2019-02-25 DIAGNOSIS — Z853 Personal history of malignant neoplasm of breast: Secondary | ICD-10-CM

## 2019-02-25 DIAGNOSIS — R921 Mammographic calcification found on diagnostic imaging of breast: Secondary | ICD-10-CM | POA: Diagnosis not present

## 2019-02-28 ENCOUNTER — Other Ambulatory Visit: Payer: Self-pay | Admitting: General Surgery

## 2019-04-22 DIAGNOSIS — I10 Essential (primary) hypertension: Secondary | ICD-10-CM | POA: Diagnosis not present

## 2019-04-22 DIAGNOSIS — E782 Mixed hyperlipidemia: Secondary | ICD-10-CM | POA: Diagnosis not present

## 2019-05-27 DIAGNOSIS — H16141 Punctate keratitis, right eye: Secondary | ICD-10-CM | POA: Diagnosis not present

## 2019-06-21 DIAGNOSIS — E039 Hypothyroidism, unspecified: Secondary | ICD-10-CM | POA: Diagnosis not present

## 2019-06-21 DIAGNOSIS — E7849 Other hyperlipidemia: Secondary | ICD-10-CM | POA: Diagnosis not present

## 2019-08-26 DIAGNOSIS — Z0001 Encounter for general adult medical examination with abnormal findings: Secondary | ICD-10-CM | POA: Diagnosis not present

## 2019-08-26 DIAGNOSIS — E782 Mixed hyperlipidemia: Secondary | ICD-10-CM | POA: Diagnosis not present

## 2019-08-26 DIAGNOSIS — E039 Hypothyroidism, unspecified: Secondary | ICD-10-CM | POA: Diagnosis not present

## 2019-08-26 DIAGNOSIS — I739 Peripheral vascular disease, unspecified: Secondary | ICD-10-CM | POA: Diagnosis not present

## 2019-08-26 DIAGNOSIS — K580 Irritable bowel syndrome with diarrhea: Secondary | ICD-10-CM | POA: Diagnosis not present

## 2019-08-26 DIAGNOSIS — Z6831 Body mass index (BMI) 31.0-31.9, adult: Secondary | ICD-10-CM | POA: Diagnosis not present

## 2019-08-26 DIAGNOSIS — K219 Gastro-esophageal reflux disease without esophagitis: Secondary | ICD-10-CM | POA: Diagnosis not present

## 2019-08-26 DIAGNOSIS — I10 Essential (primary) hypertension: Secondary | ICD-10-CM | POA: Diagnosis not present

## 2019-08-30 DIAGNOSIS — Z6831 Body mass index (BMI) 31.0-31.9, adult: Secondary | ICD-10-CM | POA: Diagnosis not present

## 2019-08-30 DIAGNOSIS — K219 Gastro-esophageal reflux disease without esophagitis: Secondary | ICD-10-CM | POA: Diagnosis not present

## 2019-08-30 DIAGNOSIS — K580 Irritable bowel syndrome with diarrhea: Secondary | ICD-10-CM | POA: Diagnosis not present

## 2019-08-30 DIAGNOSIS — E039 Hypothyroidism, unspecified: Secondary | ICD-10-CM | POA: Diagnosis not present

## 2019-08-30 DIAGNOSIS — I10 Essential (primary) hypertension: Secondary | ICD-10-CM | POA: Diagnosis not present

## 2019-08-30 DIAGNOSIS — M170 Bilateral primary osteoarthritis of knee: Secondary | ICD-10-CM | POA: Diagnosis not present

## 2019-08-30 DIAGNOSIS — I739 Peripheral vascular disease, unspecified: Secondary | ICD-10-CM | POA: Diagnosis not present

## 2019-08-30 DIAGNOSIS — E782 Mixed hyperlipidemia: Secondary | ICD-10-CM | POA: Diagnosis not present

## 2019-11-13 ENCOUNTER — Other Ambulatory Visit: Payer: Self-pay | Admitting: General Surgery

## 2019-11-13 DIAGNOSIS — Z853 Personal history of malignant neoplasm of breast: Secondary | ICD-10-CM

## 2019-11-20 ENCOUNTER — Other Ambulatory Visit: Payer: Self-pay | Admitting: Family Medicine

## 2019-11-20 DIAGNOSIS — Z853 Personal history of malignant neoplasm of breast: Secondary | ICD-10-CM

## 2019-11-20 DIAGNOSIS — Z1231 Encounter for screening mammogram for malignant neoplasm of breast: Secondary | ICD-10-CM

## 2019-11-28 ENCOUNTER — Ambulatory Visit
Admission: RE | Admit: 2019-11-28 | Discharge: 2019-11-28 | Disposition: A | Discharge status: Home / Self Care | Payer: Medicare Other | Source: Ambulatory Visit | Attending: Family Medicine | Admitting: Family Medicine

## 2019-11-28 DIAGNOSIS — Z1231 Encounter for screening mammogram for malignant neoplasm of breast: Secondary | ICD-10-CM

## 2019-11-28 DIAGNOSIS — Z853 Personal history of malignant neoplasm of breast: Secondary | ICD-10-CM

## 2019-11-28 DIAGNOSIS — R928 Other abnormal and inconclusive findings on diagnostic imaging of breast: Secondary | ICD-10-CM | POA: Diagnosis not present

## 2019-12-10 DIAGNOSIS — L304 Erythema intertrigo: Secondary | ICD-10-CM | POA: Diagnosis not present

## 2019-12-10 DIAGNOSIS — D225 Melanocytic nevi of trunk: Secondary | ICD-10-CM | POA: Diagnosis not present

## 2019-12-10 DIAGNOSIS — L820 Inflamed seborrheic keratosis: Secondary | ICD-10-CM | POA: Diagnosis not present

## 2020-02-24 DIAGNOSIS — K5732 Diverticulitis of large intestine without perforation or abscess without bleeding: Secondary | ICD-10-CM | POA: Diagnosis not present

## 2020-02-24 DIAGNOSIS — E782 Mixed hyperlipidemia: Secondary | ICD-10-CM | POA: Diagnosis not present

## 2020-02-24 DIAGNOSIS — K219 Gastro-esophageal reflux disease without esophagitis: Secondary | ICD-10-CM | POA: Diagnosis not present

## 2020-02-24 DIAGNOSIS — I10 Essential (primary) hypertension: Secondary | ICD-10-CM | POA: Diagnosis not present

## 2020-02-24 DIAGNOSIS — E039 Hypothyroidism, unspecified: Secondary | ICD-10-CM | POA: Diagnosis not present

## 2020-02-26 ENCOUNTER — Ambulatory Visit
Admission: RE | Admit: 2020-02-26 | Discharge: 2020-02-26 | Disposition: A | Discharge status: Home / Self Care | Payer: Medicare Other | Source: Ambulatory Visit | Attending: Family Medicine | Admitting: Family Medicine

## 2020-02-26 ENCOUNTER — Other Ambulatory Visit: Payer: Self-pay

## 2020-02-26 DIAGNOSIS — Z853 Personal history of malignant neoplasm of breast: Secondary | ICD-10-CM

## 2020-02-26 DIAGNOSIS — R928 Other abnormal and inconclusive findings on diagnostic imaging of breast: Secondary | ICD-10-CM | POA: Diagnosis not present

## 2020-02-26 DIAGNOSIS — Z1231 Encounter for screening mammogram for malignant neoplasm of breast: Secondary | ICD-10-CM | POA: Diagnosis not present

## 2020-02-27 DIAGNOSIS — Z6831 Body mass index (BMI) 31.0-31.9, adult: Secondary | ICD-10-CM | POA: Diagnosis not present

## 2020-02-27 DIAGNOSIS — K580 Irritable bowel syndrome with diarrhea: Secondary | ICD-10-CM | POA: Diagnosis not present

## 2020-02-27 DIAGNOSIS — Z23 Encounter for immunization: Secondary | ICD-10-CM | POA: Diagnosis not present

## 2020-02-27 DIAGNOSIS — E782 Mixed hyperlipidemia: Secondary | ICD-10-CM | POA: Diagnosis not present

## 2020-02-27 DIAGNOSIS — M170 Bilateral primary osteoarthritis of knee: Secondary | ICD-10-CM | POA: Diagnosis not present

## 2020-02-27 DIAGNOSIS — E039 Hypothyroidism, unspecified: Secondary | ICD-10-CM | POA: Diagnosis not present

## 2020-02-27 DIAGNOSIS — K2100 Gastro-esophageal reflux disease with esophagitis, without bleeding: Secondary | ICD-10-CM | POA: Diagnosis not present

## 2020-02-27 DIAGNOSIS — I10 Essential (primary) hypertension: Secondary | ICD-10-CM | POA: Diagnosis not present

## 2020-05-21 DIAGNOSIS — Z23 Encounter for immunization: Secondary | ICD-10-CM | POA: Diagnosis not present

## 2020-05-23 DIAGNOSIS — E039 Hypothyroidism, unspecified: Secondary | ICD-10-CM | POA: Insufficient documentation

## 2020-05-23 DIAGNOSIS — Z96651 Presence of right artificial knee joint: Secondary | ICD-10-CM | POA: Insufficient documentation

## 2020-07-11 DIAGNOSIS — K2100 Gastro-esophageal reflux disease with esophagitis, without bleeding: Secondary | ICD-10-CM | POA: Diagnosis not present

## 2020-07-11 DIAGNOSIS — R1310 Dysphagia, unspecified: Secondary | ICD-10-CM | POA: Diagnosis not present

## 2020-07-11 DIAGNOSIS — M170 Bilateral primary osteoarthritis of knee: Secondary | ICD-10-CM | POA: Diagnosis not present

## 2020-07-11 DIAGNOSIS — Z6832 Body mass index (BMI) 32.0-32.9, adult: Secondary | ICD-10-CM | POA: Diagnosis not present

## 2020-07-13 DIAGNOSIS — M170 Bilateral primary osteoarthritis of knee: Secondary | ICD-10-CM | POA: Insufficient documentation

## 2020-07-15 DIAGNOSIS — E039 Hypothyroidism, unspecified: Secondary | ICD-10-CM | POA: Diagnosis not present

## 2020-07-15 DIAGNOSIS — I499 Cardiac arrhythmia, unspecified: Secondary | ICD-10-CM | POA: Diagnosis not present

## 2020-07-15 DIAGNOSIS — K2100 Gastro-esophageal reflux disease with esophagitis, without bleeding: Secondary | ICD-10-CM | POA: Insufficient documentation

## 2020-07-15 DIAGNOSIS — Z96651 Presence of right artificial knee joint: Secondary | ICD-10-CM | POA: Diagnosis not present

## 2020-07-15 DIAGNOSIS — M25561 Pain in right knee: Secondary | ICD-10-CM | POA: Diagnosis not present

## 2020-07-15 DIAGNOSIS — R5383 Other fatigue: Secondary | ICD-10-CM | POA: Diagnosis not present

## 2020-07-15 DIAGNOSIS — M170 Bilateral primary osteoarthritis of knee: Secondary | ICD-10-CM | POA: Diagnosis not present

## 2020-07-15 DIAGNOSIS — G8929 Other chronic pain: Secondary | ICD-10-CM | POA: Diagnosis not present

## 2020-07-15 DIAGNOSIS — M25562 Pain in left knee: Secondary | ICD-10-CM | POA: Diagnosis not present

## 2020-07-15 DIAGNOSIS — R1310 Dysphagia, unspecified: Secondary | ICD-10-CM | POA: Diagnosis not present

## 2020-07-15 DIAGNOSIS — K5732 Diverticulitis of large intestine without perforation or abscess without bleeding: Secondary | ICD-10-CM | POA: Diagnosis not present

## 2020-07-28 DIAGNOSIS — M25561 Pain in right knee: Secondary | ICD-10-CM | POA: Diagnosis not present

## 2020-07-28 DIAGNOSIS — G8929 Other chronic pain: Secondary | ICD-10-CM | POA: Diagnosis not present

## 2020-07-28 DIAGNOSIS — M25562 Pain in left knee: Secondary | ICD-10-CM | POA: Diagnosis not present

## 2020-07-28 DIAGNOSIS — I499 Cardiac arrhythmia, unspecified: Secondary | ICD-10-CM | POA: Diagnosis not present

## 2020-07-28 DIAGNOSIS — K2100 Gastro-esophageal reflux disease with esophagitis, without bleeding: Secondary | ICD-10-CM | POA: Diagnosis not present

## 2020-07-28 DIAGNOSIS — M170 Bilateral primary osteoarthritis of knee: Secondary | ICD-10-CM | POA: Diagnosis not present

## 2020-07-30 DIAGNOSIS — G8929 Other chronic pain: Secondary | ICD-10-CM | POA: Diagnosis not present

## 2020-07-30 DIAGNOSIS — M170 Bilateral primary osteoarthritis of knee: Secondary | ICD-10-CM | POA: Diagnosis not present

## 2020-07-30 DIAGNOSIS — M25561 Pain in right knee: Secondary | ICD-10-CM | POA: Diagnosis not present

## 2020-07-30 DIAGNOSIS — M25562 Pain in left knee: Secondary | ICD-10-CM | POA: Diagnosis not present

## 2020-07-30 DIAGNOSIS — I499 Cardiac arrhythmia, unspecified: Secondary | ICD-10-CM | POA: Diagnosis not present

## 2020-07-30 DIAGNOSIS — K2100 Gastro-esophageal reflux disease with esophagitis, without bleeding: Secondary | ICD-10-CM | POA: Diagnosis not present

## 2020-08-03 DIAGNOSIS — M25561 Pain in right knee: Secondary | ICD-10-CM | POA: Diagnosis not present

## 2020-08-03 DIAGNOSIS — G8929 Other chronic pain: Secondary | ICD-10-CM | POA: Diagnosis not present

## 2020-08-03 DIAGNOSIS — I499 Cardiac arrhythmia, unspecified: Secondary | ICD-10-CM | POA: Diagnosis not present

## 2020-08-03 DIAGNOSIS — K2100 Gastro-esophageal reflux disease with esophagitis, without bleeding: Secondary | ICD-10-CM | POA: Diagnosis not present

## 2020-08-03 DIAGNOSIS — M25562 Pain in left knee: Secondary | ICD-10-CM | POA: Diagnosis not present

## 2020-08-03 DIAGNOSIS — M170 Bilateral primary osteoarthritis of knee: Secondary | ICD-10-CM | POA: Diagnosis not present

## 2020-08-06 DIAGNOSIS — K2100 Gastro-esophageal reflux disease with esophagitis, without bleeding: Secondary | ICD-10-CM | POA: Diagnosis not present

## 2020-08-06 DIAGNOSIS — I499 Cardiac arrhythmia, unspecified: Secondary | ICD-10-CM | POA: Diagnosis not present

## 2020-08-06 DIAGNOSIS — M25562 Pain in left knee: Secondary | ICD-10-CM | POA: Diagnosis not present

## 2020-08-06 DIAGNOSIS — M25561 Pain in right knee: Secondary | ICD-10-CM | POA: Diagnosis not present

## 2020-08-06 DIAGNOSIS — G8929 Other chronic pain: Secondary | ICD-10-CM | POA: Diagnosis not present

## 2020-08-06 DIAGNOSIS — M170 Bilateral primary osteoarthritis of knee: Secondary | ICD-10-CM | POA: Diagnosis not present

## 2020-08-11 DIAGNOSIS — M25561 Pain in right knee: Secondary | ICD-10-CM | POA: Diagnosis not present

## 2020-08-11 DIAGNOSIS — G8929 Other chronic pain: Secondary | ICD-10-CM | POA: Diagnosis not present

## 2020-08-11 DIAGNOSIS — I499 Cardiac arrhythmia, unspecified: Secondary | ICD-10-CM | POA: Diagnosis not present

## 2020-08-11 DIAGNOSIS — M170 Bilateral primary osteoarthritis of knee: Secondary | ICD-10-CM | POA: Diagnosis not present

## 2020-08-11 DIAGNOSIS — K2100 Gastro-esophageal reflux disease with esophagitis, without bleeding: Secondary | ICD-10-CM | POA: Diagnosis not present

## 2020-08-11 DIAGNOSIS — M25562 Pain in left knee: Secondary | ICD-10-CM | POA: Diagnosis not present

## 2020-08-14 DIAGNOSIS — I499 Cardiac arrhythmia, unspecified: Secondary | ICD-10-CM | POA: Diagnosis not present

## 2020-08-14 DIAGNOSIS — M25562 Pain in left knee: Secondary | ICD-10-CM | POA: Diagnosis not present

## 2020-08-14 DIAGNOSIS — M170 Bilateral primary osteoarthritis of knee: Secondary | ICD-10-CM | POA: Diagnosis not present

## 2020-08-14 DIAGNOSIS — E039 Hypothyroidism, unspecified: Secondary | ICD-10-CM | POA: Diagnosis not present

## 2020-08-14 DIAGNOSIS — Z96651 Presence of right artificial knee joint: Secondary | ICD-10-CM | POA: Diagnosis not present

## 2020-08-14 DIAGNOSIS — R1310 Dysphagia, unspecified: Secondary | ICD-10-CM | POA: Diagnosis not present

## 2020-08-14 DIAGNOSIS — K5732 Diverticulitis of large intestine without perforation or abscess without bleeding: Secondary | ICD-10-CM | POA: Diagnosis not present

## 2020-08-14 DIAGNOSIS — G8929 Other chronic pain: Secondary | ICD-10-CM | POA: Diagnosis not present

## 2020-08-14 DIAGNOSIS — M25561 Pain in right knee: Secondary | ICD-10-CM | POA: Diagnosis not present

## 2020-08-14 DIAGNOSIS — R5383 Other fatigue: Secondary | ICD-10-CM | POA: Diagnosis not present

## 2020-08-14 DIAGNOSIS — K2100 Gastro-esophageal reflux disease with esophagitis, without bleeding: Secondary | ICD-10-CM | POA: Diagnosis not present

## 2020-08-18 DIAGNOSIS — K2100 Gastro-esophageal reflux disease with esophagitis, without bleeding: Secondary | ICD-10-CM | POA: Diagnosis not present

## 2020-08-18 DIAGNOSIS — M25561 Pain in right knee: Secondary | ICD-10-CM | POA: Diagnosis not present

## 2020-08-18 DIAGNOSIS — M25562 Pain in left knee: Secondary | ICD-10-CM | POA: Diagnosis not present

## 2020-08-18 DIAGNOSIS — M170 Bilateral primary osteoarthritis of knee: Secondary | ICD-10-CM | POA: Diagnosis not present

## 2020-08-18 DIAGNOSIS — R1310 Dysphagia, unspecified: Secondary | ICD-10-CM | POA: Diagnosis not present

## 2020-08-18 DIAGNOSIS — R5383 Other fatigue: Secondary | ICD-10-CM | POA: Diagnosis not present

## 2020-08-18 DIAGNOSIS — G8929 Other chronic pain: Secondary | ICD-10-CM | POA: Diagnosis not present

## 2020-08-18 DIAGNOSIS — I4891 Unspecified atrial fibrillation: Secondary | ICD-10-CM | POA: Diagnosis not present

## 2020-08-20 DIAGNOSIS — I499 Cardiac arrhythmia, unspecified: Secondary | ICD-10-CM | POA: Diagnosis not present

## 2020-08-20 DIAGNOSIS — G8929 Other chronic pain: Secondary | ICD-10-CM | POA: Diagnosis not present

## 2020-08-20 DIAGNOSIS — K2100 Gastro-esophageal reflux disease with esophagitis, without bleeding: Secondary | ICD-10-CM | POA: Diagnosis not present

## 2020-08-20 DIAGNOSIS — M170 Bilateral primary osteoarthritis of knee: Secondary | ICD-10-CM | POA: Diagnosis not present

## 2020-08-20 DIAGNOSIS — M25561 Pain in right knee: Secondary | ICD-10-CM | POA: Diagnosis not present

## 2020-08-20 DIAGNOSIS — M25562 Pain in left knee: Secondary | ICD-10-CM | POA: Diagnosis not present

## 2020-08-25 DIAGNOSIS — G8929 Other chronic pain: Secondary | ICD-10-CM | POA: Diagnosis not present

## 2020-08-25 DIAGNOSIS — M25561 Pain in right knee: Secondary | ICD-10-CM | POA: Diagnosis not present

## 2020-08-25 DIAGNOSIS — M25562 Pain in left knee: Secondary | ICD-10-CM | POA: Diagnosis not present

## 2020-08-25 DIAGNOSIS — I499 Cardiac arrhythmia, unspecified: Secondary | ICD-10-CM | POA: Diagnosis not present

## 2020-08-25 DIAGNOSIS — M170 Bilateral primary osteoarthritis of knee: Secondary | ICD-10-CM | POA: Diagnosis not present

## 2020-08-25 DIAGNOSIS — K2100 Gastro-esophageal reflux disease with esophagitis, without bleeding: Secondary | ICD-10-CM | POA: Diagnosis not present

## 2020-08-28 DIAGNOSIS — I10 Essential (primary) hypertension: Secondary | ICD-10-CM | POA: Diagnosis not present

## 2020-08-28 DIAGNOSIS — E782 Mixed hyperlipidemia: Secondary | ICD-10-CM | POA: Diagnosis not present

## 2020-08-28 DIAGNOSIS — K2100 Gastro-esophageal reflux disease with esophagitis, without bleeding: Secondary | ICD-10-CM | POA: Diagnosis not present

## 2020-08-28 DIAGNOSIS — E7849 Other hyperlipidemia: Secondary | ICD-10-CM | POA: Diagnosis not present

## 2020-08-28 DIAGNOSIS — K219 Gastro-esophageal reflux disease without esophagitis: Secondary | ICD-10-CM | POA: Diagnosis not present

## 2020-08-28 DIAGNOSIS — E039 Hypothyroidism, unspecified: Secondary | ICD-10-CM | POA: Diagnosis not present

## 2020-08-31 DIAGNOSIS — K580 Irritable bowel syndrome with diarrhea: Secondary | ICD-10-CM | POA: Diagnosis not present

## 2020-08-31 DIAGNOSIS — I10 Essential (primary) hypertension: Secondary | ICD-10-CM | POA: Diagnosis not present

## 2020-08-31 DIAGNOSIS — E7849 Other hyperlipidemia: Secondary | ICD-10-CM | POA: Diagnosis not present

## 2020-08-31 DIAGNOSIS — K2100 Gastro-esophageal reflux disease with esophagitis, without bleeding: Secondary | ICD-10-CM | POA: Diagnosis not present

## 2020-08-31 DIAGNOSIS — I739 Peripheral vascular disease, unspecified: Secondary | ICD-10-CM | POA: Diagnosis not present

## 2020-08-31 DIAGNOSIS — E039 Hypothyroidism, unspecified: Secondary | ICD-10-CM | POA: Diagnosis not present

## 2020-08-31 DIAGNOSIS — I7300 Raynaud's syndrome without gangrene: Secondary | ICD-10-CM | POA: Diagnosis not present

## 2020-08-31 DIAGNOSIS — Z0001 Encounter for general adult medical examination with abnormal findings: Secondary | ICD-10-CM | POA: Diagnosis not present

## 2020-09-01 DIAGNOSIS — K2100 Gastro-esophageal reflux disease with esophagitis, without bleeding: Secondary | ICD-10-CM | POA: Diagnosis not present

## 2020-09-01 DIAGNOSIS — M25562 Pain in left knee: Secondary | ICD-10-CM | POA: Diagnosis not present

## 2020-09-01 DIAGNOSIS — M25561 Pain in right knee: Secondary | ICD-10-CM | POA: Diagnosis not present

## 2020-09-01 DIAGNOSIS — M170 Bilateral primary osteoarthritis of knee: Secondary | ICD-10-CM | POA: Diagnosis not present

## 2020-09-01 DIAGNOSIS — I499 Cardiac arrhythmia, unspecified: Secondary | ICD-10-CM | POA: Diagnosis not present

## 2020-09-01 DIAGNOSIS — G8929 Other chronic pain: Secondary | ICD-10-CM | POA: Diagnosis not present

## 2020-09-10 DIAGNOSIS — M25561 Pain in right knee: Secondary | ICD-10-CM | POA: Diagnosis not present

## 2020-09-10 DIAGNOSIS — M25562 Pain in left knee: Secondary | ICD-10-CM | POA: Diagnosis not present

## 2020-09-10 DIAGNOSIS — K2100 Gastro-esophageal reflux disease with esophagitis, without bleeding: Secondary | ICD-10-CM | POA: Diagnosis not present

## 2020-09-10 DIAGNOSIS — I499 Cardiac arrhythmia, unspecified: Secondary | ICD-10-CM | POA: Diagnosis not present

## 2020-09-10 DIAGNOSIS — G8929 Other chronic pain: Secondary | ICD-10-CM | POA: Diagnosis not present

## 2020-09-10 DIAGNOSIS — M170 Bilateral primary osteoarthritis of knee: Secondary | ICD-10-CM | POA: Diagnosis not present

## 2020-09-29 DIAGNOSIS — H04123 Dry eye syndrome of bilateral lacrimal glands: Secondary | ICD-10-CM | POA: Diagnosis not present

## 2020-09-29 DIAGNOSIS — H35341 Macular cyst, hole, or pseudohole, right eye: Secondary | ICD-10-CM | POA: Diagnosis not present

## 2020-12-31 ENCOUNTER — Other Ambulatory Visit: Payer: Self-pay

## 2020-12-31 ENCOUNTER — Encounter: Payer: Self-pay | Admitting: Physician Assistant

## 2020-12-31 ENCOUNTER — Ambulatory Visit (INDEPENDENT_AMBULATORY_CARE_PROVIDER_SITE_OTHER): Payer: Medicare Other | Admitting: Physician Assistant

## 2020-12-31 DIAGNOSIS — L304 Erythema intertrigo: Secondary | ICD-10-CM

## 2020-12-31 DIAGNOSIS — L821 Other seborrheic keratosis: Secondary | ICD-10-CM | POA: Diagnosis not present

## 2021-01-05 ENCOUNTER — Encounter: Payer: Self-pay | Admitting: Physician Assistant

## 2021-01-05 NOTE — Progress Notes (Signed)
   New Patient   Subjective  Dorothy Glenn is a 87 y.o. female who presents for the following: Annual Exam (History of bcc tx mohs, no new concerns today).   The following portions of the chart were reviewed this encounter and updated as appropriate:  Tobacco  Allergies  Meds  Problems  Med Hx  Surg Hx  Fam Hx      Objective  Well appearing patient in no apparent distress; mood and affect are within normal limits.  A full examination was performed including scalp, head, eyes, ears, nose, lips, neck, chest, axillae, abdomen, back, buttocks, bilateral upper extremities, bilateral lower extremities, hands, feet, fingers, toes, fingernails, and toenails. All findings within normal limits unless otherwise noted below.  Left Inframammary Fold, Right Inframammary Fold Erythema and slight scale  Neck Many keratoses face chest neck   Assessment & Plan  Intertrigo Left Inframammary Fold; Right Inframammary Fold  OTC hydrocortisone and clotrimazole 1-2 x per day. Keep affected areas dry.  Seborrheic keratosis Neck  observe     I, Korver Graybeal, PA-C, have reviewed all documentation's for this visit.  The documentation on 01/05/21 for the exam, diagnosis, procedures and orders are all accurate and complete.

## 2021-01-26 ENCOUNTER — Other Ambulatory Visit: Payer: Self-pay | Admitting: Family Medicine

## 2021-01-26 DIAGNOSIS — Z1231 Encounter for screening mammogram for malignant neoplasm of breast: Secondary | ICD-10-CM

## 2021-03-02 ENCOUNTER — Other Ambulatory Visit: Payer: Self-pay

## 2021-03-02 ENCOUNTER — Ambulatory Visit
Admission: RE | Admit: 2021-03-02 | Discharge: 2021-03-02 | Disposition: A | Discharge status: Home / Self Care | Payer: Medicare Other | Source: Ambulatory Visit | Attending: Family Medicine | Admitting: Family Medicine

## 2021-03-02 DIAGNOSIS — Z1231 Encounter for screening mammogram for malignant neoplasm of breast: Secondary | ICD-10-CM

## 2021-03-16 DIAGNOSIS — I739 Peripheral vascular disease, unspecified: Secondary | ICD-10-CM | POA: Diagnosis not present

## 2021-03-16 DIAGNOSIS — E7849 Other hyperlipidemia: Secondary | ICD-10-CM | POA: Diagnosis not present

## 2021-03-16 DIAGNOSIS — Z23 Encounter for immunization: Secondary | ICD-10-CM | POA: Diagnosis not present

## 2021-03-16 DIAGNOSIS — Z7189 Other specified counseling: Secondary | ICD-10-CM | POA: Diagnosis not present

## 2021-03-16 DIAGNOSIS — K580 Irritable bowel syndrome with diarrhea: Secondary | ICD-10-CM | POA: Diagnosis not present

## 2021-03-16 DIAGNOSIS — I10 Essential (primary) hypertension: Secondary | ICD-10-CM | POA: Diagnosis not present

## 2021-03-16 DIAGNOSIS — E039 Hypothyroidism, unspecified: Secondary | ICD-10-CM | POA: Diagnosis not present

## 2021-03-16 DIAGNOSIS — I7300 Raynaud's syndrome without gangrene: Secondary | ICD-10-CM | POA: Diagnosis not present

## 2021-04-08 DIAGNOSIS — I70203 Unspecified atherosclerosis of native arteries of extremities, bilateral legs: Secondary | ICD-10-CM | POA: Diagnosis not present

## 2021-04-08 DIAGNOSIS — M79676 Pain in unspecified toe(s): Secondary | ICD-10-CM | POA: Diagnosis not present

## 2021-04-08 DIAGNOSIS — B351 Tinea unguium: Secondary | ICD-10-CM | POA: Diagnosis not present

## 2021-04-08 DIAGNOSIS — L84 Corns and callosities: Secondary | ICD-10-CM | POA: Diagnosis not present

## 2021-04-12 DIAGNOSIS — H18832 Recurrent erosion of cornea, left eye: Secondary | ICD-10-CM | POA: Diagnosis not present

## 2021-04-14 DIAGNOSIS — H18832 Recurrent erosion of cornea, left eye: Secondary | ICD-10-CM | POA: Diagnosis not present

## 2021-04-19 DIAGNOSIS — H1089 Other conjunctivitis: Secondary | ICD-10-CM | POA: Diagnosis not present

## 2021-04-22 DIAGNOSIS — H1089 Other conjunctivitis: Secondary | ICD-10-CM | POA: Diagnosis not present

## 2021-05-04 DIAGNOSIS — H16223 Keratoconjunctivitis sicca, not specified as Sjogren's, bilateral: Secondary | ICD-10-CM | POA: Diagnosis not present

## 2021-09-03 ENCOUNTER — Other Ambulatory Visit
Admission: RE | Admit: 2021-09-03 | Discharge: 2021-09-03 | Disposition: A | Discharge status: Home / Self Care | Payer: Medicare Other | Attending: Family Medicine | Admitting: Family Medicine

## 2021-09-03 DIAGNOSIS — E782 Mixed hyperlipidemia: Secondary | ICD-10-CM | POA: Diagnosis not present

## 2021-09-03 DIAGNOSIS — E039 Hypothyroidism, unspecified: Secondary | ICD-10-CM | POA: Insufficient documentation

## 2021-09-03 LAB — COMPREHENSIVE METABOLIC PANEL
ALT: 13 U/L (ref 0–44)
AST: 17 U/L (ref 15–41)
Albumin: 3.9 g/dL (ref 3.5–5.0)
Alkaline Phosphatase: 85 U/L (ref 38–126)
Anion gap: 8 (ref 5–15)
BUN: 19 mg/dL (ref 8–23)
CO2: 28 mmol/L (ref 22–32)
Calcium: 9 mg/dL (ref 8.9–10.3)
Chloride: 107 mmol/L (ref 98–111)
Creatinine, Ser: 1.14 mg/dL — ABNORMAL HIGH (ref 0.44–1.00)
GFR, Estimated: 46 mL/min — ABNORMAL LOW (ref >60)
Glucose, Bld: 109 mg/dL — ABNORMAL HIGH (ref 70–99)
Potassium: 4.3 mmol/L (ref 3.5–5.1)
Sodium: 143 mmol/L (ref 135–145)
Total Bilirubin: 0.8 mg/dL (ref 0.3–1.2)
Total Protein: 7.1 g/dL (ref 6.5–8.1)

## 2021-09-03 LAB — LIPID PANEL
Cholesterol: 191 mg/dL (ref 0–200)
HDL: 52 mg/dL (ref >40)
LDL Cholesterol: 109 mg/dL — ABNORMAL HIGH (ref 0–99)
Total CHOL/HDL Ratio: 3.7 RATIO
Triglycerides: 151 mg/dL — ABNORMAL HIGH (ref <150)
VLDL: 30 mg/dL (ref 0–40)

## 2021-09-03 LAB — TSH: TSH: 1.846 u[IU]/mL (ref 0.350–4.500)

## 2021-09-07 DIAGNOSIS — Z0001 Encounter for general adult medical examination with abnormal findings: Secondary | ICD-10-CM | POA: Diagnosis not present

## 2021-09-07 DIAGNOSIS — I10 Essential (primary) hypertension: Secondary | ICD-10-CM | POA: Diagnosis not present

## 2021-09-07 DIAGNOSIS — I7300 Raynaud's syndrome without gangrene: Secondary | ICD-10-CM | POA: Diagnosis not present

## 2021-09-07 DIAGNOSIS — I739 Peripheral vascular disease, unspecified: Secondary | ICD-10-CM | POA: Diagnosis not present

## 2021-09-07 DIAGNOSIS — E7849 Other hyperlipidemia: Secondary | ICD-10-CM | POA: Diagnosis not present

## 2021-09-07 DIAGNOSIS — Z6832 Body mass index (BMI) 32.0-32.9, adult: Secondary | ICD-10-CM | POA: Diagnosis not present

## 2021-09-07 DIAGNOSIS — E039 Hypothyroidism, unspecified: Secondary | ICD-10-CM | POA: Diagnosis not present

## 2021-09-07 DIAGNOSIS — K580 Irritable bowel syndrome with diarrhea: Secondary | ICD-10-CM | POA: Diagnosis not present

## 2021-10-15 DIAGNOSIS — H04123 Dry eye syndrome of bilateral lacrimal glands: Secondary | ICD-10-CM | POA: Diagnosis not present

## 2021-10-15 DIAGNOSIS — H31011 Macula scars of posterior pole (postinflammatory) (post-traumatic), right eye: Secondary | ICD-10-CM | POA: Diagnosis not present

## 2021-11-09 ENCOUNTER — Other Ambulatory Visit
Admission: RE | Admit: 2021-11-09 | Discharge: 2021-11-09 | Disposition: A | Discharge status: Home / Self Care | Payer: Medicare Other | Attending: Family Medicine | Admitting: Family Medicine

## 2021-11-09 DIAGNOSIS — Z6832 Body mass index (BMI) 32.0-32.9, adult: Secondary | ICD-10-CM | POA: Diagnosis not present

## 2021-11-09 DIAGNOSIS — R0602 Shortness of breath: Secondary | ICD-10-CM | POA: Diagnosis not present

## 2021-11-09 DIAGNOSIS — R079 Chest pain, unspecified: Secondary | ICD-10-CM | POA: Insufficient documentation

## 2021-11-09 LAB — BASIC METABOLIC PANEL
Anion gap: 7 (ref 5–15)
BUN: 16 mg/dL (ref 8–23)
CO2: 28 mmol/L (ref 22–32)
Calcium: 9 mg/dL (ref 8.9–10.3)
Chloride: 108 mmol/L (ref 98–111)
Creatinine, Ser: 0.99 mg/dL (ref 0.44–1.00)
GFR, Estimated: 54 mL/min — ABNORMAL LOW (ref >60)
Glucose, Bld: 97 mg/dL (ref 70–99)
Potassium: 4.3 mmol/L (ref 3.5–5.1)
Sodium: 143 mmol/L (ref 135–145)

## 2021-11-09 LAB — D-DIMER, QUANTITATIVE: D-Dimer, Quant: 0.63 ug/mL-FEU — ABNORMAL HIGH (ref 0.00–0.50)

## 2021-11-09 LAB — CBC
HCT: 39.4 % (ref 36.0–46.0)
Hemoglobin: 12.3 g/dL (ref 12.0–15.0)
MCH: 28.6 pg (ref 26.0–34.0)
MCHC: 31.2 g/dL (ref 30.0–36.0)
MCV: 91.6 fL (ref 80.0–100.0)
Platelets: 182 10*3/uL (ref 150–400)
RBC: 4.3 MIL/uL (ref 3.87–5.11)
RDW: 13.2 % (ref 11.5–15.5)
WBC: 5.4 10*3/uL (ref 4.0–10.5)
nRBC: 0 % (ref 0.0–0.2)

## 2021-11-09 LAB — TROPONIN I (HIGH SENSITIVITY): Troponin I (High Sensitivity): 5 ng/L (ref <18)

## 2021-11-09 LAB — BRAIN NATRIURETIC PEPTIDE: B Natriuretic Peptide: 186.1 pg/mL — ABNORMAL HIGH (ref 0.0–100.0)

## 2021-11-10 ENCOUNTER — Other Ambulatory Visit: Payer: Self-pay | Admitting: Family Medicine

## 2021-11-10 ENCOUNTER — Other Ambulatory Visit (HOSPITAL_COMMUNITY): Payer: Self-pay | Admitting: Family Medicine

## 2021-11-10 DIAGNOSIS — R7989 Other specified abnormal findings of blood chemistry: Secondary | ICD-10-CM

## 2021-11-10 DIAGNOSIS — R0602 Shortness of breath: Secondary | ICD-10-CM

## 2021-11-19 ENCOUNTER — Ambulatory Visit
Admission: RE | Admit: 2021-11-19 | Discharge: 2021-11-19 | Disposition: A | Discharge status: Home / Self Care | Payer: Medicare Other | Source: Ambulatory Visit | Attending: Family Medicine | Admitting: Family Medicine

## 2021-11-19 DIAGNOSIS — R7989 Other specified abnormal findings of blood chemistry: Secondary | ICD-10-CM | POA: Diagnosis not present

## 2021-11-19 DIAGNOSIS — R0602 Shortness of breath: Secondary | ICD-10-CM | POA: Insufficient documentation

## 2021-11-19 DIAGNOSIS — J90 Pleural effusion, not elsewhere classified: Secondary | ICD-10-CM | POA: Diagnosis not present

## 2021-11-19 MED ORDER — IOHEXOL 350 MG/ML SOLN
75.0000 mL | Freq: Once | INTRAVENOUS | Status: AC | PRN
  Administered 2021-11-19: 75 mL via INTRAVENOUS

## 2022-01-27 ENCOUNTER — Other Ambulatory Visit: Payer: Self-pay | Admitting: Family Medicine

## 2022-01-27 DIAGNOSIS — Z1231 Encounter for screening mammogram for malignant neoplasm of breast: Secondary | ICD-10-CM

## 2022-03-01 ENCOUNTER — Other Ambulatory Visit
Admission: RE | Admit: 2022-03-01 | Discharge: 2022-03-01 | Disposition: A | Discharge status: Home / Self Care | Payer: Medicare Other | Attending: Family Medicine | Admitting: Family Medicine

## 2022-03-01 DIAGNOSIS — K5732 Diverticulitis of large intestine without perforation or abscess without bleeding: Secondary | ICD-10-CM | POA: Insufficient documentation

## 2022-03-01 DIAGNOSIS — I10 Essential (primary) hypertension: Secondary | ICD-10-CM | POA: Insufficient documentation

## 2022-03-01 DIAGNOSIS — K2100 Gastro-esophageal reflux disease with esophagitis, without bleeding: Secondary | ICD-10-CM | POA: Diagnosis not present

## 2022-03-01 DIAGNOSIS — E039 Hypothyroidism, unspecified: Secondary | ICD-10-CM | POA: Diagnosis not present

## 2022-03-01 LAB — COMPREHENSIVE METABOLIC PANEL WITH GFR
ALT: 12 U/L (ref 0–44)
AST: 17 U/L (ref 15–41)
Albumin: 3.7 g/dL (ref 3.5–5.0)
Alkaline Phosphatase: 83 U/L (ref 38–126)
Anion gap: 6 (ref 5–15)
BUN: 15 mg/dL (ref 8–23)
CO2: 25 mmol/L (ref 22–32)
Calcium: 8.7 mg/dL — ABNORMAL LOW (ref 8.9–10.3)
Chloride: 111 mmol/L (ref 98–111)
Creatinine, Ser: 0.96 mg/dL (ref 0.44–1.00)
GFR, Estimated: 56 mL/min — ABNORMAL LOW
Glucose, Bld: 111 mg/dL — ABNORMAL HIGH (ref 70–99)
Potassium: 3.9 mmol/L (ref 3.5–5.1)
Sodium: 142 mmol/L (ref 135–145)
Total Bilirubin: 0.7 mg/dL (ref 0.3–1.2)
Total Protein: 6.6 g/dL (ref 6.5–8.1)

## 2022-03-01 LAB — CBC WITH DIFFERENTIAL/PLATELET
Abs Immature Granulocytes: 0.02 10*3/uL (ref 0.00–0.07)
Basophils Absolute: 0.1 10*3/uL (ref 0.0–0.1)
Basophils Relative: 1 %
Eosinophils Absolute: 0.2 10*3/uL (ref 0.0–0.5)
Eosinophils Relative: 3 %
HCT: 39 % (ref 36.0–46.0)
Hemoglobin: 12.5 g/dL (ref 12.0–15.0)
Immature Granulocytes: 0 %
Lymphocytes Relative: 25 %
Lymphs Abs: 1.4 10*3/uL (ref 0.7–4.0)
MCH: 28.7 pg (ref 26.0–34.0)
MCHC: 32.1 g/dL (ref 30.0–36.0)
MCV: 89.4 fL (ref 80.0–100.0)
Monocytes Absolute: 0.3 10*3/uL (ref 0.1–1.0)
Monocytes Relative: 6 %
Neutro Abs: 3.7 10*3/uL (ref 1.7–7.7)
Neutrophils Relative %: 65 %
Platelets: 172 10*3/uL (ref 150–400)
RBC: 4.36 MIL/uL (ref 3.87–5.11)
RDW: 13 % (ref 11.5–15.5)
WBC: 5.6 10*3/uL (ref 4.0–10.5)
nRBC: 0 % (ref 0.0–0.2)

## 2022-03-01 LAB — TSH: TSH: 1.943 u[IU]/mL (ref 0.350–4.500)

## 2022-03-07 ENCOUNTER — Ambulatory Visit
Admission: RE | Admit: 2022-03-07 | Discharge: 2022-03-07 | Disposition: A | Discharge status: Home / Self Care | Payer: Medicare Other | Source: Ambulatory Visit | Attending: Family Medicine | Admitting: Family Medicine

## 2022-03-07 DIAGNOSIS — Z1231 Encounter for screening mammogram for malignant neoplasm of breast: Secondary | ICD-10-CM | POA: Diagnosis not present

## 2022-03-08 DIAGNOSIS — K2100 Gastro-esophageal reflux disease with esophagitis, without bleeding: Secondary | ICD-10-CM | POA: Diagnosis not present

## 2022-03-08 DIAGNOSIS — J301 Allergic rhinitis due to pollen: Secondary | ICD-10-CM | POA: Diagnosis not present

## 2022-03-08 DIAGNOSIS — M170 Bilateral primary osteoarthritis of knee: Secondary | ICD-10-CM | POA: Diagnosis not present

## 2022-03-08 DIAGNOSIS — I739 Peripheral vascular disease, unspecified: Secondary | ICD-10-CM | POA: Diagnosis not present

## 2022-03-08 DIAGNOSIS — K580 Irritable bowel syndrome with diarrhea: Secondary | ICD-10-CM | POA: Diagnosis not present

## 2022-03-08 DIAGNOSIS — Z6832 Body mass index (BMI) 32.0-32.9, adult: Secondary | ICD-10-CM | POA: Diagnosis not present

## 2022-03-08 DIAGNOSIS — I7300 Raynaud's syndrome without gangrene: Secondary | ICD-10-CM | POA: Diagnosis not present

## 2022-03-08 DIAGNOSIS — G309 Alzheimer's disease, unspecified: Secondary | ICD-10-CM | POA: Diagnosis not present

## 2022-03-08 DIAGNOSIS — I10 Essential (primary) hypertension: Secondary | ICD-10-CM | POA: Diagnosis not present

## 2022-03-08 DIAGNOSIS — E7849 Other hyperlipidemia: Secondary | ICD-10-CM | POA: Diagnosis not present

## 2022-03-08 DIAGNOSIS — F0280 Dementia in other diseases classified elsewhere without behavioral disturbance: Secondary | ICD-10-CM | POA: Diagnosis not present

## 2022-03-08 DIAGNOSIS — E039 Hypothyroidism, unspecified: Secondary | ICD-10-CM | POA: Diagnosis not present

## 2022-06-11 ENCOUNTER — Emergency Department: Payer: Medicare Other

## 2022-06-11 ENCOUNTER — Emergency Department
Admission: EM | Admit: 2022-06-11 | Discharge: 2022-06-12 | Disposition: A | Discharge status: Home / Self Care | Payer: Medicare Other | Attending: Emergency Medicine | Admitting: Emergency Medicine

## 2022-06-11 ENCOUNTER — Other Ambulatory Visit: Payer: Self-pay

## 2022-06-11 DIAGNOSIS — R911 Solitary pulmonary nodule: Secondary | ICD-10-CM | POA: Diagnosis not present

## 2022-06-11 DIAGNOSIS — I10 Essential (primary) hypertension: Secondary | ICD-10-CM

## 2022-06-11 DIAGNOSIS — R42 Dizziness and giddiness: Secondary | ICD-10-CM

## 2022-06-11 DIAGNOSIS — R0902 Hypoxemia: Secondary | ICD-10-CM | POA: Diagnosis not present

## 2022-06-11 DIAGNOSIS — R110 Nausea: Secondary | ICD-10-CM | POA: Diagnosis not present

## 2022-06-11 LAB — CBC
HCT: 40 % (ref 36.0–46.0)
Hemoglobin: 12.6 g/dL (ref 12.0–15.0)
MCH: 28.9 pg (ref 26.0–34.0)
MCHC: 31.5 g/dL (ref 30.0–36.0)
MCV: 91.7 fL (ref 80.0–100.0)
Platelets: 181 10*3/uL (ref 150–400)
RBC: 4.36 MIL/uL (ref 3.87–5.11)
RDW: 13.1 % (ref 11.5–15.5)
WBC: 5.8 10*3/uL (ref 4.0–10.5)
nRBC: 0 % (ref 0.0–0.2)

## 2022-06-11 LAB — TROPONIN I (HIGH SENSITIVITY)
Troponin I (High Sensitivity): 7 ng/L (ref <18)
Troponin I (High Sensitivity): 9 ng/L (ref <18)

## 2022-06-11 LAB — BASIC METABOLIC PANEL
Anion gap: 9 (ref 5–15)
BUN: 20 mg/dL (ref 8–23)
CO2: 26 mmol/L (ref 22–32)
Calcium: 9.1 mg/dL (ref 8.9–10.3)
Chloride: 106 mmol/L (ref 98–111)
Creatinine, Ser: 0.95 mg/dL (ref 0.44–1.00)
GFR, Estimated: 57 mL/min — ABNORMAL LOW (ref >60)
Glucose, Bld: 129 mg/dL — ABNORMAL HIGH (ref 70–99)
Potassium: 4.2 mmol/L (ref 3.5–5.1)
Sodium: 141 mmol/L (ref 135–145)

## 2022-06-11 NOTE — ED Notes (Signed)
First Nurse Note: Pt arrives via [facility] from home with complaints of dizziness, nausea & HTN- takes metoprolol for afib - no known hx of HTN.  VS with EMS  218/103 CBG-149 74 HR 98% RA

## 2022-06-11 NOTE — ED Triage Notes (Signed)
BIB [facility] from home. C/o dizziness and HTN 40 min pta. Reports bp of 198/136 at home with associated weakness and dizziness. Denies chest pain or pressure. Pt continues to endorse dizziness and HTN noted in triage. Pt is alert and oriented following commands. Breathing unlabored speaking in full sentences with symmetric chest rise and fall. Pt son reports pt takes daily ASA and metoprolol for tachycardia.

## 2022-06-12 NOTE — Discharge Instructions (Addendum)
As we discussed, though you do have high blood pressure (hypertension), fortunately it is not immediately dangerous at this time and does not need emergency intervention or admission to the hospital.  If we add to or change your regular medications, we may cause more harm than good - it is more appropriate for your primary care doctor to evaluate you in clinic and decide if any medication changes are needed.  Please follow up in clinic as recommended in these papers.    As we discussed, there appears to be at least 1 pulmonary nodule in your right upper lobe on chest x-ray.  The radiologist recommended that you consider getting a CT scan with IV contrast as an outpatient.  Please follow-up with your regular doctor about this; Dr. Quillian Quince may already be aware and have a plan.  Return to the Emergency Department (ED) if you experience any worsening chest pain/pressure/tightness, difficulty breathing, or sudden sweating, or other symptoms that concern you.

## 2022-06-12 NOTE — ED Provider Notes (Signed)
[facility] Provider Note    Event Date/Time   First MD Initiated Contact with Patient 06/12/22 0002     (approximate)   History   Hypertension and Dizziness   HPI  Dorothy Glenn is a 89 y.o. female who presents for evaluation of high blood pressure and dizziness.  She is accompanied by her adult son with whom she lives.  She was feeling okay but then around dinnertime this evening she started to feel a sensation that she describes as dizziness, but she also says that "it felt like a sickness".  Her son told me that at the time she was saying it felt like the room was spinning.  He helped her to the bedroom then had her lie down and then the symptoms continued for a period of time.  They checked her blood pressure at home and it was very elevated, nearly 200/120.  This is very abnormal for her as she is usually in the 130s.  She has had a history of tachycardia in the past for which she takes metoprolol and she did not have her evening dose which she usually takes around 8 PM.  The patient denies having any pain including chest pain or abdominal pain.  She has had no shortness of breath.  No recent fever.  No nausea, vomiting, nor diarrhea.  No dysuria.  Nothing in particular seems to make the symptoms better or worse.  Her blood pressure came down somewhat but is still elevated from her baseline in the ED.  Her symptoms have improved and she is worried about being dizzy but she no longer feels that way.  She has had no numbness nor weakness in her extremities, no word finding difficulties, no difficulty speaking or thinking.  She has no history of ACS/MI and no history of blood clots in the legs of the lungs nor CVA.     Physical Exam   Triage Vital Signs: ED Triage Vitals [06/11/22 1950]  Enc Vitals Group     BP (!) 184/77     Pulse Rate 70     Resp 18     Temp 97.7 F (36.5 C)     Temp Source Oral     SpO2 97 %     Weight 99.8 kg (220 lb)     Height  1.727 m ('5\' 8"'$ )     Head Circumference      Peak Flow      Pain Score 0     Pain Loc      Pain Edu?      Excl. in [place]?     Most recent vital signs: Vitals:   06/12/22 0000 06/12/22 0100  BP:  (!) 156/64  Pulse:  66  Resp: 18 18  Temp:    SpO2:  98%     General: Awake, no distress.  Alert, oriented, somewhat hard of hearing but acting very appropriate.  Appears younger than chronological age. CV:  Good peripheral perfusion.  Normal heart sounds.  Regular rate and rhythm. Resp:  Normal effort.  Speaking easily and comfortably, no accessory muscle usage nor intercostal retractions.  Lungs are clear to auscultation.  Patient is not tachypneic when I evaluated her as was previously documented and her vital signs. Abd:  No distention.  No tenderness to palpation. Other:  Mood and affect are appropriate.  No focal neurological deficits are appreciated; she has no dysmetria on finger-to-nose testing, no weakness on testing of upper and  lower extremities, no aphasia, no dysarthria.   ED Results / Procedures / Treatments   Labs (all labs ordered are listed, but only abnormal results are displayed) Labs Reviewed  BASIC METABOLIC PANEL - Abnormal; Notable for the following components:      Result Value   Glucose, Bld 129 (*)    GFR, Estimated 57 (*)    All other components within normal limits  CBC  TROPONIN I (HIGH SENSITIVITY)  TROPONIN I (HIGH SENSITIVITY)     EKG  ED ECG REPORT I, Hinda Kehr, the attending physician, personally viewed and interpreted this ECG.  Date: 06/11/2022 EKG Time: 19: 52 Rate: 71 Rhythm: normal sinus rhythm QRS Axis: Left axis deviation Intervals: normal ST/T Wave abnormalities: Non-specific ST segment / T-wave changes, but no clear evidence of acute ischemia. Narrative Interpretation: no definitive evidence of acute ischemia; does not meet STEMI criteria.    RADIOLOGY I viewed and interpreted the patient's CT head and I see no evidence of  intracranial bleed or CVA.  I also read the radiologist's report, which confirmed no acute findings.      PROCEDURES:  Critical Care performed: No  .1-3 Lead EKG Interpretation  Performed by: Hinda Kehr, MD Authorized by: Hinda Kehr, MD     Interpretation: normal     ECG rate:  68   ECG rate assessment: normal     Rhythm: sinus rhythm     Ectopy: none     Conduction: normal      MEDICATIONS ORDERED IN ED: Medications - No data to display   IMPRESSION / MDM / [place] / ED COURSE  I reviewed the triage vital signs and the nursing notes.                              Differential diagnosis includes, but is not limited to, transient hypertension, acute intracranial hemorrhage, CVA, peripheral vertigo, electrolyte or metabolic abnormality, ACS, neoplasm.  Patient's presentation is most consistent with acute presentation with potential threat to life or bodily function.  Labs/studies ordered: EKG, two-view chest x-ray, head CT without contrast, CBC, basic metabolic panel, high-sensitivity troponin x 2.  The patient was on the cardiac monitor to evaluate for evidence of arrhythmia and/or significant heart rate changes.  Vital signs are notable for hypertension but improved from what it was at home.  Most notably the patient's blood pressure has come down to 156/64, and that is without her having her evening dose of metoprolol.  Workup is also reassuring.  Labs are all within normal limits, no evidence of ischemia on EKG, and imaging is essentially normal as documented above.  I provided reassurance.  I offered an MRI to rule out CVA, but the patient's son does not think she would be able to tolerate being in the MRI, and I have a low enough suspicion, particular given a normal neurological exam at this time, that I do not think it is necessary.  The patient and her son are very comfortable with discharge and outpatient follow-up with her primary care doctor, and  the son said that he will call tomorrow and try to schedule an earlier visit than her regular appointment which is scheduled for 3 weeks from now.  I also mention to them the nodule seen in the right upper lobe of her lung by the radiologist and they said that her primary care doctor has already done some "lung test" on her.  They will talk to her regular doctor about it rather than having me refer them to the pulmonary nodule clinic.  I gave my usual and customary return precautions.      FINAL CLINICAL IMPRESSION(S) / ED DIAGNOSES   Final diagnoses:  Dizziness  Hypertension, unspecified type  Pulmonary nodule     Rx / DC Orders   ED Discharge Orders     None        Note:  This document was prepared using Dragon voice recognition software and may include unintentional dictation errors.   Hinda Kehr, MD 06/12/22 [phone]

## 2022-06-13 DIAGNOSIS — R42 Dizziness and giddiness: Secondary | ICD-10-CM | POA: Diagnosis not present

## 2022-06-13 DIAGNOSIS — I10 Essential (primary) hypertension: Secondary | ICD-10-CM | POA: Diagnosis not present

## 2022-06-13 DIAGNOSIS — Z6831 Body mass index (BMI) 31.0-31.9, adult: Secondary | ICD-10-CM | POA: Diagnosis not present

## 2022-07-05 DIAGNOSIS — E7849 Other hyperlipidemia: Secondary | ICD-10-CM | POA: Diagnosis not present

## 2022-07-05 DIAGNOSIS — E039 Hypothyroidism, unspecified: Secondary | ICD-10-CM | POA: Diagnosis not present

## 2022-07-05 DIAGNOSIS — I739 Peripheral vascular disease, unspecified: Secondary | ICD-10-CM | POA: Diagnosis not present

## 2022-07-05 DIAGNOSIS — Z6831 Body mass index (BMI) 31.0-31.9, adult: Secondary | ICD-10-CM | POA: Diagnosis not present

## 2022-07-05 DIAGNOSIS — M170 Bilateral primary osteoarthritis of knee: Secondary | ICD-10-CM | POA: Diagnosis not present

## 2022-07-05 DIAGNOSIS — F0280 Dementia in other diseases classified elsewhere without behavioral disturbance: Secondary | ICD-10-CM | POA: Diagnosis not present

## 2022-07-05 DIAGNOSIS — I7300 Raynaud's syndrome without gangrene: Secondary | ICD-10-CM | POA: Diagnosis not present

## 2022-07-05 DIAGNOSIS — K580 Irritable bowel syndrome with diarrhea: Secondary | ICD-10-CM | POA: Diagnosis not present

## 2022-07-05 DIAGNOSIS — I10 Essential (primary) hypertension: Secondary | ICD-10-CM | POA: Diagnosis not present

## 2022-07-05 DIAGNOSIS — J301 Allergic rhinitis due to pollen: Secondary | ICD-10-CM | POA: Diagnosis not present

## 2022-07-05 DIAGNOSIS — K2100 Gastro-esophageal reflux disease with esophagitis, without bleeding: Secondary | ICD-10-CM | POA: Diagnosis not present

## 2022-07-05 DIAGNOSIS — G309 Alzheimer's disease, unspecified: Secondary | ICD-10-CM | POA: Diagnosis not present

## 2022-08-02 DIAGNOSIS — H25042 Posterior subcapsular polar age-related cataract, left eye: Secondary | ICD-10-CM | POA: Diagnosis not present

## 2022-08-15 DIAGNOSIS — H26492 Other secondary cataract, left eye: Secondary | ICD-10-CM | POA: Diagnosis not present

## 2022-10-19 DIAGNOSIS — C44519 Basal cell carcinoma of skin of other part of trunk: Secondary | ICD-10-CM | POA: Diagnosis not present

## 2022-10-19 DIAGNOSIS — Z85828 Personal history of other malignant neoplasm of skin: Secondary | ICD-10-CM | POA: Diagnosis not present

## 2022-10-19 DIAGNOSIS — D225 Melanocytic nevi of trunk: Secondary | ICD-10-CM | POA: Diagnosis not present

## 2022-10-19 DIAGNOSIS — D2262 Melanocytic nevi of left upper limb, including shoulder: Secondary | ICD-10-CM | POA: Diagnosis not present

## 2022-10-19 DIAGNOSIS — Z08 Encounter for follow-up examination after completed treatment for malignant neoplasm: Secondary | ICD-10-CM | POA: Diagnosis not present

## 2022-10-19 DIAGNOSIS — D485 Neoplasm of uncertain behavior of skin: Secondary | ICD-10-CM | POA: Diagnosis not present

## 2022-10-19 DIAGNOSIS — D2261 Melanocytic nevi of right upper limb, including shoulder: Secondary | ICD-10-CM | POA: Diagnosis not present

## 2022-10-19 DIAGNOSIS — D2272 Melanocytic nevi of left lower limb, including hip: Secondary | ICD-10-CM | POA: Diagnosis not present

## 2022-11-02 DIAGNOSIS — K2100 Gastro-esophageal reflux disease with esophagitis, without bleeding: Secondary | ICD-10-CM | POA: Diagnosis not present

## 2022-11-02 DIAGNOSIS — G309 Alzheimer's disease, unspecified: Secondary | ICD-10-CM | POA: Diagnosis not present

## 2022-11-02 DIAGNOSIS — M170 Bilateral primary osteoarthritis of knee: Secondary | ICD-10-CM | POA: Diagnosis not present

## 2022-11-02 DIAGNOSIS — I10 Essential (primary) hypertension: Secondary | ICD-10-CM | POA: Diagnosis not present

## 2022-11-02 DIAGNOSIS — E559 Vitamin D deficiency, unspecified: Secondary | ICD-10-CM | POA: Diagnosis not present

## 2022-11-02 DIAGNOSIS — I7300 Raynaud's syndrome without gangrene: Secondary | ICD-10-CM | POA: Diagnosis not present

## 2022-11-02 DIAGNOSIS — E039 Hypothyroidism, unspecified: Secondary | ICD-10-CM | POA: Diagnosis not present

## 2022-11-02 DIAGNOSIS — M810 Age-related osteoporosis without current pathological fracture: Secondary | ICD-10-CM | POA: Diagnosis not present

## 2022-11-02 DIAGNOSIS — Z0001 Encounter for general adult medical examination with abnormal findings: Secondary | ICD-10-CM | POA: Diagnosis not present

## 2022-11-02 DIAGNOSIS — I739 Peripheral vascular disease, unspecified: Secondary | ICD-10-CM | POA: Diagnosis not present

## 2022-11-02 DIAGNOSIS — E7849 Other hyperlipidemia: Secondary | ICD-10-CM | POA: Diagnosis not present

## 2022-11-02 DIAGNOSIS — K580 Irritable bowel syndrome with diarrhea: Secondary | ICD-10-CM | POA: Diagnosis not present

## 2022-12-23 DIAGNOSIS — H35341 Macular cyst, hole, or pseudohole, right eye: Secondary | ICD-10-CM | POA: Diagnosis not present

## 2022-12-23 DIAGNOSIS — H16223 Keratoconjunctivitis sicca, not specified as Sjogren's, bilateral: Secondary | ICD-10-CM | POA: Diagnosis not present

## 2022-12-23 DIAGNOSIS — H43813 Vitreous degeneration, bilateral: Secondary | ICD-10-CM | POA: Diagnosis not present

## 2022-12-23 DIAGNOSIS — H26492 Other secondary cataract, left eye: Secondary | ICD-10-CM | POA: Diagnosis not present

## 2023-01-08 ENCOUNTER — Inpatient Hospital Stay: Payer: Medicare Other

## 2023-01-08 ENCOUNTER — Inpatient Hospital Stay
Admission: EM | Admit: 2023-01-08 | Discharge: 2023-01-11 | DRG: 482 | Disposition: A | Discharge status: Skilled Nursing Facility | Payer: Medicare Other | Attending: Osteopathic Medicine | Admitting: Osteopathic Medicine

## 2023-01-08 ENCOUNTER — Encounter
Admission: EM | Disposition: A | Discharge status: Skilled Nursing Facility | Payer: Self-pay | Source: Home / Self Care | Attending: Internal Medicine

## 2023-01-08 ENCOUNTER — Inpatient Hospital Stay: Payer: Medicare Other | Admitting: Certified Registered Nurse Anesthetist

## 2023-01-08 ENCOUNTER — Other Ambulatory Visit: Payer: Self-pay

## 2023-01-08 ENCOUNTER — Emergency Department: Payer: Medicare Other

## 2023-01-08 ENCOUNTER — Encounter: Payer: Self-pay | Admitting: Internal Medicine

## 2023-01-08 DIAGNOSIS — K580 Irritable bowel syndrome with diarrhea: Secondary | ICD-10-CM | POA: Diagnosis present

## 2023-01-08 DIAGNOSIS — Z87891 Personal history of nicotine dependence: Secondary | ICD-10-CM | POA: Diagnosis not present

## 2023-01-08 DIAGNOSIS — M810 Age-related osteoporosis without current pathological fracture: Secondary | ICD-10-CM | POA: Diagnosis present

## 2023-01-08 DIAGNOSIS — G8929 Other chronic pain: Secondary | ICD-10-CM | POA: Diagnosis present

## 2023-01-08 DIAGNOSIS — W010XXA Fall on same level from slipping, tripping and stumbling without subsequent striking against object, initial encounter: Secondary | ICD-10-CM | POA: Diagnosis present

## 2023-01-08 DIAGNOSIS — R29898 Other symptoms and signs involving the musculoskeletal system: Secondary | ICD-10-CM | POA: Diagnosis not present

## 2023-01-08 DIAGNOSIS — W19XXXA Unspecified fall, initial encounter: Secondary | ICD-10-CM | POA: Diagnosis not present

## 2023-01-08 DIAGNOSIS — Z7982 Long term (current) use of aspirin: Secondary | ICD-10-CM | POA: Diagnosis not present

## 2023-01-08 DIAGNOSIS — H9190 Unspecified hearing loss, unspecified ear: Secondary | ICD-10-CM | POA: Diagnosis not present

## 2023-01-08 DIAGNOSIS — D62 Acute posthemorrhagic anemia: Secondary | ICD-10-CM | POA: Diagnosis not present

## 2023-01-08 DIAGNOSIS — Y9301 Activity, walking, marching and hiking: Secondary | ICD-10-CM | POA: Diagnosis present

## 2023-01-08 DIAGNOSIS — H5461 Unqualified visual loss, right eye, normal vision left eye: Secondary | ICD-10-CM | POA: Diagnosis not present

## 2023-01-08 DIAGNOSIS — S79912A Unspecified injury of left hip, initial encounter: Secondary | ICD-10-CM | POA: Diagnosis not present

## 2023-01-08 DIAGNOSIS — Z853 Personal history of malignant neoplasm of breast: Secondary | ICD-10-CM | POA: Diagnosis not present

## 2023-01-08 DIAGNOSIS — S72002A Fracture of unspecified part of neck of left femur, initial encounter for closed fracture: Secondary | ICD-10-CM | POA: Diagnosis not present

## 2023-01-08 DIAGNOSIS — K589 Irritable bowel syndrome without diarrhea: Secondary | ICD-10-CM | POA: Diagnosis not present

## 2023-01-08 DIAGNOSIS — Z9841 Cataract extraction status, right eye: Secondary | ICD-10-CM | POA: Diagnosis not present

## 2023-01-08 DIAGNOSIS — D638 Anemia in other chronic diseases classified elsewhere: Secondary | ICD-10-CM | POA: Diagnosis present

## 2023-01-08 DIAGNOSIS — S72142A Displaced intertrochanteric fracture of left femur, initial encounter for closed fracture: Secondary | ICD-10-CM | POA: Diagnosis not present

## 2023-01-08 DIAGNOSIS — I10 Essential (primary) hypertension: Secondary | ICD-10-CM | POA: Insufficient documentation

## 2023-01-08 DIAGNOSIS — W19XXXD Unspecified fall, subsequent encounter: Secondary | ICD-10-CM | POA: Diagnosis not present

## 2023-01-08 DIAGNOSIS — M1712 Unilateral primary osteoarthritis, left knee: Secondary | ICD-10-CM | POA: Diagnosis not present

## 2023-01-08 DIAGNOSIS — I272 Pulmonary hypertension, unspecified: Secondary | ICD-10-CM | POA: Diagnosis present

## 2023-01-08 DIAGNOSIS — Z79899 Other long term (current) drug therapy: Secondary | ICD-10-CM | POA: Diagnosis not present

## 2023-01-08 DIAGNOSIS — M25562 Pain in left knee: Secondary | ICD-10-CM | POA: Diagnosis not present

## 2023-01-08 DIAGNOSIS — Z96651 Presence of right artificial knee joint: Secondary | ICD-10-CM | POA: Diagnosis not present

## 2023-01-08 DIAGNOSIS — I739 Peripheral vascular disease, unspecified: Secondary | ICD-10-CM | POA: Diagnosis not present

## 2023-01-08 DIAGNOSIS — Z888 Allergy status to other drugs, medicaments and biological substances status: Secondary | ICD-10-CM

## 2023-01-08 DIAGNOSIS — E039 Hypothyroidism, unspecified: Secondary | ICD-10-CM | POA: Diagnosis present

## 2023-01-08 DIAGNOSIS — Z7401 Bed confinement status: Secondary | ICD-10-CM | POA: Diagnosis not present

## 2023-01-08 DIAGNOSIS — I517 Cardiomegaly: Secondary | ICD-10-CM | POA: Diagnosis not present

## 2023-01-08 DIAGNOSIS — E559 Vitamin D deficiency, unspecified: Secondary | ICD-10-CM | POA: Diagnosis not present

## 2023-01-08 DIAGNOSIS — S72142D Displaced intertrochanteric fracture of left femur, subsequent encounter for closed fracture with routine healing: Secondary | ICD-10-CM | POA: Diagnosis not present

## 2023-01-08 DIAGNOSIS — Z01818 Encounter for other preprocedural examination: Secondary | ICD-10-CM | POA: Diagnosis not present

## 2023-01-08 DIAGNOSIS — Z85828 Personal history of other malignant neoplasm of skin: Secondary | ICD-10-CM

## 2023-01-08 DIAGNOSIS — Y92009 Unspecified place in unspecified non-institutional (private) residence as the place of occurrence of the external cause: Secondary | ICD-10-CM

## 2023-01-08 DIAGNOSIS — K219 Gastro-esophageal reflux disease without esophagitis: Secondary | ICD-10-CM | POA: Diagnosis not present

## 2023-01-08 DIAGNOSIS — M179 Osteoarthritis of knee, unspecified: Secondary | ICD-10-CM | POA: Diagnosis present

## 2023-01-08 DIAGNOSIS — M47816 Spondylosis without myelopathy or radiculopathy, lumbar region: Secondary | ICD-10-CM | POA: Diagnosis not present

## 2023-01-08 DIAGNOSIS — Z79890 Hormone replacement therapy (postmenopausal): Secondary | ICD-10-CM | POA: Diagnosis not present

## 2023-01-08 DIAGNOSIS — M1710 Unilateral primary osteoarthritis, unspecified knee: Secondary | ICD-10-CM | POA: Diagnosis not present

## 2023-01-08 DIAGNOSIS — M25552 Pain in left hip: Secondary | ICD-10-CM | POA: Diagnosis not present

## 2023-01-08 HISTORY — PX: INTRAMEDULLARY (IM) NAIL INTERTROCHANTERIC: SHX5875

## 2023-01-08 LAB — CBC WITH DIFFERENTIAL/PLATELET
Abs Immature Granulocytes: 0.02 10*3/uL (ref 0.00–0.07)
Basophils Absolute: 0.1 10*3/uL (ref 0.0–0.1)
Basophils Relative: 1 %
Eosinophils Absolute: 0.1 10*3/uL (ref 0.0–0.5)
Eosinophils Relative: 2 %
HCT: 35 % — ABNORMAL LOW (ref 36.0–46.0)
Hemoglobin: 11.1 g/dL — ABNORMAL LOW (ref 12.0–15.0)
Immature Granulocytes: 0 %
Lymphocytes Relative: 30 %
Lymphs Abs: 2.1 10*3/uL (ref 0.7–4.0)
MCH: 29.6 pg (ref 26.0–34.0)
MCHC: 31.7 g/dL (ref 30.0–36.0)
MCV: 93.3 fL (ref 80.0–100.0)
Monocytes Absolute: 0.5 10*3/uL (ref 0.1–1.0)
Monocytes Relative: 7 %
Neutro Abs: 4.1 10*3/uL (ref 1.7–7.7)
Neutrophils Relative %: 60 %
Platelets: 147 10*3/uL — ABNORMAL LOW (ref 150–400)
RBC: 3.75 MIL/uL — ABNORMAL LOW (ref 3.87–5.11)
RDW: 14.1 % (ref 11.5–15.5)
WBC: 6.8 10*3/uL (ref 4.0–10.5)
nRBC: 0 % (ref 0.0–0.2)

## 2023-01-08 LAB — BASIC METABOLIC PANEL
Anion gap: 10 (ref 5–15)
BUN: 21 mg/dL (ref 8–23)
CO2: 23 mmol/L (ref 22–32)
Calcium: 8.9 mg/dL (ref 8.9–10.3)
Chloride: 105 mmol/L (ref 98–111)
Creatinine, Ser: 0.9 mg/dL (ref 0.44–1.00)
GFR, Estimated: >60 mL/min (ref >60)
Glucose, Bld: 108 mg/dL — ABNORMAL HIGH (ref 70–99)
Potassium: 3.7 mmol/L (ref 3.5–5.1)
Sodium: 138 mmol/L (ref 135–145)

## 2023-01-08 LAB — TYPE AND SCREEN
ABO/RH(D): A POS
Antibody Screen: NEGATIVE

## 2023-01-08 LAB — PROTIME-INR
INR: 1 (ref 0.8–1.2)
Prothrombin Time: 13.6 seconds (ref 11.4–15.2)

## 2023-01-08 SURGERY — FIXATION, FRACTURE, INTERTROCHANTERIC, WITH INTRAMEDULLARY ROD
Anesthesia: General | Laterality: Left

## 2023-01-08 MED ORDER — OXYCODONE HCL 5 MG PO TABS
5.0000 mg | ORAL_TABLET | Freq: Once | ORAL | Status: AC | PRN
  Administered 2023-01-08: 5 mg via ORAL

## 2023-01-08 MED ORDER — ACETAMINOPHEN 325 MG PO TABS
325.0000 mg | ORAL_TABLET | Freq: Four times a day (QID) | ORAL | Status: DC | PRN
  Administered 2023-01-09: 650 mg via ORAL
  Filled 2023-01-08: qty 2

## 2023-01-08 MED ORDER — DEXAMETHASONE SODIUM PHOSPHATE 10 MG/ML IJ SOLN
INTRAMUSCULAR | Status: DC | PRN
  Administered 2023-01-08: 10 mg via INTRAVENOUS

## 2023-01-08 MED ORDER — PHENOL 1.4 % MT LIQD: 1.0000 | OROMUCOSAL | Status: DC | PRN

## 2023-01-08 MED ORDER — BUPIVACAINE-EPINEPHRINE 0.5% -1:200000 IJ SOLN
INTRAMUSCULAR | Status: DC | PRN
  Administered 2023-01-08: 30 mL

## 2023-01-08 MED ORDER — PHENYLEPHRINE HCL-NACL 20-0.9 MG/250ML-% IV SOLN
INTRAVENOUS | Status: AC
  Filled 2023-01-08: qty 250

## 2023-01-08 MED ORDER — ACETAMINOPHEN 10 MG/ML IV SOLN
INTRAVENOUS | Status: AC
  Filled 2023-01-08: qty 100

## 2023-01-08 MED ORDER — BISACODYL 10 MG RE SUPP
10.0000 mg | Freq: Every day | RECTAL | Status: DC | PRN
  Filled 2023-01-08: qty 1

## 2023-01-08 MED ORDER — PHENYLEPHRINE HCL-NACL 20-0.9 MG/250ML-% IV SOLN
INTRAVENOUS | Status: DC | PRN
  Administered 2023-01-08: 25 ug/min via INTRAVENOUS

## 2023-01-08 MED ORDER — ACETAMINOPHEN 10 MG/ML IV SOLN
INTRAVENOUS | Status: DC | PRN
  Administered 2023-01-08: 1000 mg via INTRAVENOUS

## 2023-01-08 MED ORDER — HYDROCODONE-ACETAMINOPHEN 5-325 MG PO TABS
1.0000 | ORAL_TABLET | ORAL | Status: DC | PRN
  Administered 2023-01-08 – 2023-01-10 (×6): 1 via ORAL
  Administered 2023-01-10: 2 via ORAL
  Administered 2023-01-10: 1 via ORAL
  Administered 2023-01-11: 2 via ORAL
  Filled 2023-01-08 (×7): qty 1
  Filled 2023-01-08 (×2): qty 2

## 2023-01-08 MED ORDER — KETOROLAC TROMETHAMINE 15 MG/ML IJ SOLN: 15.0000 mg | Freq: Once | INTRAMUSCULAR | Status: DC

## 2023-01-08 MED ORDER — CEFAZOLIN SODIUM-DEXTROSE 2-4 GM/100ML-% IV SOLN
2.0000 g | Freq: Once | INTRAVENOUS | Status: AC
  Administered 2023-01-08: 2 g via INTRAVENOUS

## 2023-01-08 MED ORDER — ONDANSETRON HCL 4 MG/2ML IJ SOLN: 4.0000 mg | Freq: Four times a day (QID) | INTRAMUSCULAR | Status: DC | PRN

## 2023-01-08 MED ORDER — PROPOFOL 1000 MG/100ML IV EMUL
INTRAVENOUS | Status: AC
  Filled 2023-01-08: qty 100

## 2023-01-08 MED ORDER — IRBESARTAN 150 MG PO TABS
150.0000 mg | ORAL_TABLET | Freq: Every day | ORAL | Status: DC
  Administered 2023-01-08 – 2023-01-11 (×4): 150 mg via ORAL
  Filled 2023-01-08 (×4): qty 1

## 2023-01-08 MED ORDER — LIDOCAINE HCL (PF) 2 % IJ SOLN
INTRAMUSCULAR | Status: AC
  Filled 2023-01-08: qty 5

## 2023-01-08 MED ORDER — OXYCODONE HCL 5 MG PO TABS
ORAL_TABLET | ORAL | Status: AC
  Filled 2023-01-08: qty 1

## 2023-01-08 MED ORDER — FENTANYL CITRATE (PF) 100 MCG/2ML IJ SOLN
INTRAMUSCULAR | Status: AC
  Filled 2023-01-08: qty 2

## 2023-01-08 MED ORDER — ONDANSETRON HCL 4 MG PO TABS: 4.0000 mg | ORAL_TABLET | Freq: Four times a day (QID) | ORAL | Status: DC | PRN

## 2023-01-08 MED ORDER — FENTANYL CITRATE (PF) 100 MCG/2ML IJ SOLN
INTRAMUSCULAR | Status: DC | PRN
  Administered 2023-01-08 (×2): 25 ug via INTRAVENOUS

## 2023-01-08 MED ORDER — MORPHINE SULFATE (PF) 2 MG/ML IV SOLN: 2.0000 mg | INTRAVENOUS | Status: DC | PRN

## 2023-01-08 MED ORDER — METHOCARBAMOL 500 MG PO TABS: 500.0000 mg | ORAL_TABLET | Freq: Four times a day (QID) | ORAL | Status: DC | PRN

## 2023-01-08 MED ORDER — PROPOFOL 500 MG/50ML IV EMUL
INTRAVENOUS | Status: DC | PRN
  Administered 2023-01-08: 100 ug/kg/min via INTRAVENOUS

## 2023-01-08 MED ORDER — FERROUS SULFATE 325 (65 FE) MG PO TABS
325.0000 mg | ORAL_TABLET | Freq: Every day | ORAL | Status: DC
  Administered 2023-01-09 – 2023-01-11 (×3): 325 mg via ORAL
  Filled 2023-01-08 (×3): qty 1

## 2023-01-08 MED ORDER — LIDOCAINE HCL (CARDIAC) PF 100 MG/5ML IV SOSY
PREFILLED_SYRINGE | INTRAVENOUS | Status: DC | PRN
  Administered 2023-01-08: 80 mg via INTRAVENOUS

## 2023-01-08 MED ORDER — 0.9 % SODIUM CHLORIDE (POUR BTL) OPTIME
TOPICAL | Status: DC | PRN
  Administered 2023-01-08: 1000 mL

## 2023-01-08 MED ORDER — SODIUM CHLORIDE 0.9 % IV SOLN: INTRAVENOUS | Status: DC

## 2023-01-08 MED ORDER — ENOXAPARIN SODIUM 30 MG/0.3ML IJ SOSY
30.0000 mg | PREFILLED_SYRINGE | INTRAMUSCULAR | Status: DC
  Administered 2023-01-09 – 2023-01-11 (×3): 30 mg via SUBCUTANEOUS
  Filled 2023-01-08 (×4): qty 0.3

## 2023-01-08 MED ORDER — PROPOFOL 10 MG/ML IV BOLUS
INTRAVENOUS | Status: DC | PRN
  Administered 2023-01-08: 130 mg via INTRAVENOUS

## 2023-01-08 MED ORDER — LEVOTHYROXINE SODIUM 50 MCG PO TABS
50.0000 ug | ORAL_TABLET | Freq: Every day | ORAL | Status: DC
  Administered 2023-01-09 – 2023-01-11 (×3): 50 ug via ORAL
  Filled 2023-01-08 (×3): qty 1

## 2023-01-08 MED ORDER — MORPHINE SULFATE (PF) 4 MG/ML IV SOLN
4.0000 mg | INTRAVENOUS | Status: DC | PRN
  Administered 2023-01-08: 4 mg via INTRAVENOUS
  Filled 2023-01-08: qty 1

## 2023-01-08 MED ORDER — KETOROLAC TROMETHAMINE 30 MG/ML IJ SOLN
INTRAMUSCULAR | Status: AC
  Filled 2023-01-08: qty 1

## 2023-01-08 MED ORDER — FLEET ENEMA RE ENEM: 1.0000 | ENEMA | Freq: Once | RECTAL | Status: DC | PRN

## 2023-01-08 MED ORDER — OXYCODONE HCL 5 MG/5ML PO SOLN: 5.0000 mg | Freq: Once | ORAL | Status: AC | PRN

## 2023-01-08 MED ORDER — ZOLPIDEM TARTRATE 5 MG PO TABS: 5.0000 mg | ORAL_TABLET | Freq: Every evening | ORAL | Status: DC | PRN

## 2023-01-08 MED ORDER — MAGNESIUM HYDROXIDE 400 MG/5ML PO SUSP
30.0000 mL | Freq: Every day | ORAL | Status: DC | PRN
  Administered 2023-01-11: 30 mL via ORAL
  Filled 2023-01-08: qty 30

## 2023-01-08 MED ORDER — CEFAZOLIN SODIUM-DEXTROSE 2-4 GM/100ML-% IV SOLN
2.0000 g | Freq: Three times a day (TID) | INTRAVENOUS | Status: AC
  Administered 2023-01-08 – 2023-01-09 (×3): 2 g via INTRAVENOUS
  Filled 2023-01-08 (×3): qty 100

## 2023-01-08 MED ORDER — SENNA 8.6 MG PO TABS
1.0000 | ORAL_TABLET | Freq: Two times a day (BID) | ORAL | Status: DC
  Administered 2023-01-08 – 2023-01-11 (×7): 8.6 mg via ORAL
  Filled 2023-01-08 (×7): qty 1

## 2023-01-08 MED ORDER — METOCLOPRAMIDE HCL 5 MG PO TABS: 5.0000 mg | ORAL_TABLET | Freq: Three times a day (TID) | ORAL | Status: DC | PRN

## 2023-01-08 MED ORDER — FENTANYL CITRATE (PF) 100 MCG/2ML IJ SOLN
25.0000 ug | INTRAMUSCULAR | Status: DC | PRN
  Administered 2023-01-08 (×2): 25 ug via INTRAVENOUS

## 2023-01-08 MED ORDER — GLYCOPYRROLATE 0.2 MG/ML IJ SOLN
INTRAMUSCULAR | Status: DC | PRN
  Administered 2023-01-08: .2 mg via INTRAVENOUS

## 2023-01-08 MED ORDER — MORPHINE SULFATE (PF) 2 MG/ML IV SOLN: 0.5000 mg | INTRAVENOUS | Status: DC | PRN

## 2023-01-08 MED ORDER — PANTOPRAZOLE SODIUM 40 MG PO TBEC
40.0000 mg | DELAYED_RELEASE_TABLET | Freq: Every day | ORAL | Status: DC
  Administered 2023-01-09 – 2023-01-11 (×3): 40 mg via ORAL
  Filled 2023-01-08 (×3): qty 1

## 2023-01-08 MED ORDER — ALUM & MAG HYDROXIDE-SIMETH 200-200-20 MG/5ML PO SUSP: 30.0000 mL | ORAL | Status: DC | PRN

## 2023-01-08 MED ORDER — HYDROCODONE-ACETAMINOPHEN 5-325 MG PO TABS: 1.0000 | ORAL_TABLET | Freq: Four times a day (QID) | ORAL | Status: DC | PRN

## 2023-01-08 MED ORDER — ONDANSETRON HCL 4 MG/2ML IJ SOLN
INTRAMUSCULAR | Status: DC | PRN
  Administered 2023-01-08: 4 mg via INTRAVENOUS

## 2023-01-08 MED ORDER — KETOROLAC TROMETHAMINE 30 MG/ML IJ SOLN
30.0000 mg | Freq: Once | INTRAMUSCULAR | Status: AC
  Administered 2023-01-08: 30 mg via INTRAVENOUS

## 2023-01-08 MED ORDER — SODIUM CHLORIDE 0.45 % IV SOLN: INTRAVENOUS | Status: DC

## 2023-01-08 MED ORDER — ONDANSETRON HCL 4 MG/2ML IJ SOLN
4.0000 mg | Freq: Once | INTRAMUSCULAR | Status: AC
  Administered 2023-01-08: 4 mg via INTRAVENOUS
  Filled 2023-01-08: qty 2

## 2023-01-08 MED ORDER — METHOCARBAMOL 1000 MG/10ML IJ SOLN: 500.0000 mg | Freq: Four times a day (QID) | INTRAVENOUS | Status: DC | PRN

## 2023-01-08 MED ORDER — TRANEXAMIC ACID-NACL 1000-0.7 MG/100ML-% IV SOLN
INTRAVENOUS | Status: AC
  Filled 2023-01-08: qty 100

## 2023-01-08 MED ORDER — EPHEDRINE SULFATE (PRESSORS) 50 MG/ML IJ SOLN
INTRAMUSCULAR | Status: DC | PRN
  Administered 2023-01-08: 10 mg via INTRAVENOUS

## 2023-01-08 MED ORDER — MORPHINE SULFATE (PF) 2 MG/ML IV SOLN: 1.0000 mg | INTRAVENOUS | Status: DC | PRN

## 2023-01-08 MED ORDER — TRANEXAMIC ACID-NACL 1000-0.7 MG/100ML-% IV SOLN
1000.0000 mg | INTRAVENOUS | Status: AC
  Administered 2023-01-08: 1000 mg via INTRAVENOUS
  Filled 2023-01-08: qty 100

## 2023-01-08 MED ORDER — METOCLOPRAMIDE HCL 5 MG/ML IJ SOLN: 5.0000 mg | Freq: Three times a day (TID) | INTRAMUSCULAR | Status: DC | PRN

## 2023-01-08 MED ORDER — MORPHINE SULFATE (PF) 4 MG/ML IV SOLN
4.0000 mg | Freq: Once | INTRAVENOUS | Status: AC
  Administered 2023-01-08: 4 mg via INTRAVENOUS
  Filled 2023-01-08: qty 1

## 2023-01-08 MED ORDER — SUGAMMADEX SODIUM 200 MG/2ML IV SOLN
INTRAVENOUS | Status: DC | PRN
  Administered 2023-01-08: 200 mg via INTRAVENOUS

## 2023-01-08 MED ORDER — METOPROLOL SUCCINATE ER 50 MG PO TB24
100.0000 mg | ORAL_TABLET | Freq: Every evening | ORAL | Status: DC
  Administered 2023-01-09 – 2023-01-10 (×2): 100 mg via ORAL
  Filled 2023-01-08 (×2): qty 2

## 2023-01-08 MED ORDER — GENTAMICIN SULFATE 40 MG/ML IJ SOLN
INTRAMUSCULAR | Status: DC | PRN
  Administered 2023-01-08: 80 mg via INTRAMUSCULAR

## 2023-01-08 MED ORDER — ACETAMINOPHEN 10 MG/ML IV SOLN
1000.0000 mg | Freq: Once | INTRAVENOUS | Status: AC
  Administered 2023-01-08: 1000 mg via INTRAVENOUS
  Filled 2023-01-08: qty 100

## 2023-01-08 MED ORDER — ROCURONIUM BROMIDE 100 MG/10ML IV SOLN
INTRAVENOUS | Status: DC | PRN
  Administered 2023-01-08: 40 mg via INTRAVENOUS
  Administered 2023-01-08: 10 mg via INTRAVENOUS

## 2023-01-08 MED ORDER — MENTHOL 3 MG MT LOZG: 1.0000 | LOZENGE | OROMUCOSAL | Status: DC | PRN

## 2023-01-08 SURGICAL SUPPLY — 50 items
10 mm Tapered Drill Bit IMPLANT
16 mm Flexible Drill Bit IMPLANT
3.2 mm Guide Wire, 400 mm IMPLANT
6 mm/9 mm Cannulated Stepped Drill Bit IMPLANT
APL PRP STRL LF DISP 70% ISPRP (MISCELLANEOUS) ×2
BIT DRILL CANN 16 HIP (BIT) IMPLANT
BIT DRILL CANN STP 6/9 HIP (BIT) IMPLANT
BIT DRILL LONG 4.2 (BIT) IMPLANT
BIT DRILL TAPERED 10 (BIT) IMPLANT
BLADE TFNA HELICAL 105 STRL (Anchor) IMPLANT
BNDG CMPR 5X4 CHSV STRCH STRL (GAUZE/BANDAGES/DRESSINGS) ×1
BNDG COHESIVE 4X5 TAN STRL LF (GAUZE/BANDAGES/DRESSINGS) ×2 IMPLANT
CHLORAPREP W/TINT 26 (MISCELLANEOUS) ×4 IMPLANT
DRAPE C-ARMOR (DRAPES) IMPLANT
DRAPE INCISE 23X17 STRL (DRAPES) ×2 IMPLANT
DRAPE INCISE IOBAN 23X17 STRL (DRAPES) ×1 IMPLANT
DRSG AQUACEL AG ADV 3.5X10 (GAUZE/BANDAGES/DRESSINGS) IMPLANT
DRSG AQUACEL AG ADV 3.5X14 (GAUZE/BANDAGES/DRESSINGS) IMPLANT
DRSG XEROFORM 1X8 (GAUZE/BANDAGES/DRESSINGS) IMPLANT
ELECT REM PT RETURN 9FT ADLT (ELECTROSURGICAL) ×1
ELECTRODE REM PT RTRN 9FT ADLT (ELECTROSURGICAL) ×2 IMPLANT
GAUZE SPONGE 4X4 12PLY STRL (GAUZE/BANDAGES/DRESSINGS) ×2 IMPLANT
GAUZE XEROFORM 1X8 LF (GAUZE/BANDAGES/DRESSINGS) IMPLANT
GLOVE INDICATOR 8.0 STRL GRN (GLOVE) ×2 IMPLANT
GLOVE SURG ORTHO 8.5 STRL (GLOVE) ×2 IMPLANT
GOWN STRL REUS W/ TWL LRG LVL3 (GOWN DISPOSABLE) ×2 IMPLANT
GOWN STRL REUS W/TWL LRG LVL3 (GOWN DISPOSABLE) ×1
GOWN STRL REUS W/TWL LRG LVL4 (GOWN DISPOSABLE) ×2 IMPLANT
GUIDEWIRE 3.2X400 (WIRE) IMPLANT
IMPL DEG TI CANN 11MM/130 (Orthopedic Implant) IMPLANT
IMPLANT DEG TI CANN 11MM/130 (Orthopedic Implant) ×1 IMPLANT
KIT TURNOVER KIT A (KITS) ×2 IMPLANT
MANIFOLD NEPTUNE II (INSTRUMENTS) ×2 IMPLANT
MAT ABSORB FLUID 56X50 GRAY (MISCELLANEOUS) ×2 IMPLANT
NDL SPNL 18GX3.5 QUINCKE PK (NEEDLE) ×2 IMPLANT
NEEDLE SPNL 18GX3.5 QUINCKE PK (NEEDLE) ×1 IMPLANT
NS IRRIG 500ML POUR BTL (IV SOLUTION) ×2 IMPLANT
PACK HIP COMPR (MISCELLANEOUS) ×2 IMPLANT
QC/Needle Point/Extra Long, calibrated IMPLANT
SCREW LOCKING 5.0X38MM (Screw) IMPLANT
SOL PREP PVP 2OZ (MISCELLANEOUS) ×1
SOLUTION PREP PVP 2OZ (MISCELLANEOUS) ×2 IMPLANT
STAPLER SKIN PROX 35W (STAPLE) ×2 IMPLANT
SUCTION TUBE FRAZIER 10FR DISP (SUCTIONS) ×2 IMPLANT
SUT VIC AB 0 CT1 36 (SUTURE) ×2 IMPLANT
SUT VIC AB 2-0 CT1 27 (SUTURE) ×1
SUT VIC AB 2-0 CT1 TAPERPNT 27 (SUTURE) ×2 IMPLANT
SYR 30ML LL (SYRINGE) ×2 IMPLANT
TRAP FLUID SMOKE EVACUATOR (MISCELLANEOUS) ×2 IMPLANT
WATER STERILE IRR 500ML POUR (IV SOLUTION) ×2 IMPLANT

## 2023-01-08 NOTE — Plan of Care (Signed)
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
## 2023-01-08 NOTE — Assessment & Plan Note (Signed)
Likely secondary to pain Continue to monitor

## 2023-01-08 NOTE — Assessment & Plan Note (Signed)
Continue levothyroxine 
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
## 2023-01-08 NOTE — ED Provider Notes (Signed)
[facility] Provider Note    Event Date/Time   First MD Initiated Contact with Patient 01/08/23 0120     (approximate)   History   Fall (Brought in by [employer] from home, due to a fall. Patient states she was getting out of the chair and lost balance. Patient fell on left hip, complaining of left leg pain. Denies hitting head and LOC.)   HPI Dorothy Glenn is a 89 y.o. female who presents for evaluation of pain in her left hip.  The patient was walking at home (she is relatively independent but lives with her son) and turned to turn off a light switch and somehow tripped and fell.  She did not lose consciousness or strike her head but she believes she landed on her left hip.  Her leg is shortened and has a great deal of pain when she moves it.  Otherwise she feels okay.  The pain seems to come and go in waves.  Denies headache, neck pain, chest pain, shortness of breath, or recent illness.     Physical Exam   Triage Vital Signs: ED Triage Vitals  Encounter Vitals Group     BP 01/08/23 0116 (!) 182/87     Systolic BP Percentile --      Diastolic BP Percentile --      Pulse Rate 01/08/23 0116 64     Resp --      Temp 01/08/23 0116 97.9 F (36.6 C)     Temp Source 01/08/23 0116 Oral     SpO2 01/08/23 0116 100 %     Weight --      Height 01/08/23 0117 1.727 m (5\' 8" )     Head Circumference --      Peak Flow --      Pain Score 01/08/23 0114 9     Pain Loc --      Pain Education --      Exclude from Growth Chart --     Most recent vital signs: Vitals:   01/08/23 0208 01/08/23 0230  BP:  (!) 163/78  Pulse:  67  Resp: 18 10  Temp:    SpO2:  96%    General: Awake, pain with movement, otherwise awake and alert and not in distress. CV:  Good peripheral perfusion.  Resp:  Normal effort. Speaking easily and comfortably, no accessory muscle usage nor intercostal retractions.   Abd:  No distention.  Other:  Left leg is shortened and externally rotated  consistent with probable femur fracture.  Tenderness to palpation at the most proximal part of the femur.  Pain with any attempt at range of motion.   ED Results / Procedures / Treatments   Labs (all labs ordered are listed, but only abnormal results are displayed) Labs Reviewed  BASIC METABOLIC PANEL - Abnormal; Notable for the following components:      Result Value   Glucose, Bld 108 (*)    All other components within normal limits  CBC WITH DIFFERENTIAL/PLATELET - Abnormal; Notable for the following components:   RBC 3.75 (*)    Hemoglobin 11.1 (*)    HCT 35.0 (*)    Platelets 147 (*)    All other components within normal limits  PROTIME-INR  TYPE AND SCREEN     EKG  ED ECG REPORT I, Loleta Rose, the attending physician, personally viewed and interpreted this ECG.  Date: 01/08/2023 EKG Time: 2:49 AM Rate: 78 Rhythm: normal sinus rhythm QRS Axis: Left  axis deviation Intervals: normal ST/T Wave abnormalities: Non-specific ST segment / T-wave changes, but no clear evidence of acute ischemia. Narrative Interpretation: no definitive evidence of acute ischemia; does not meet STEMI criteria.    RADIOLOGY I viewed and interpreted the patient's left hip/pelvis x-rays.  Left intertrochanteric femur fracture, confirmed by radiologist.  I also viewed and interpreted the patient's chest x-ray.  I see no evidence of pneumonia or pulmonary edema.  Radiologist mention pulmonary hypertension and mild cardiomegaly.   PROCEDURES:  Critical Care performed: No  Procedures    IMPRESSION / MDM / ASSESSMENT AND PLAN / ED COURSE  I reviewed the triage vital signs and the nursing notes.                              Differential diagnosis includes, but is not limited to, hip fracture, dislocation, pelvic fracture.  Patient's presentation is most consistent with acute presentation with potential threat to life or bodily function.  Labs/studies ordered: Type and screen, pro  time-INR (both ordered for surgical planning), CBC with differential, BMP, hip/pelvis x-rays, portable chest x-ray, EKG  Interventions/Medications given:  Medications  morphine (PF) 4 MG/ML injection 4 mg (4 mg Intravenous Given 01/08/23 0200)  HYDROcodone-acetaminophen (NORCO/VICODIN) 5-325 MG per tablet 1-2 tablet (has no administration in time range)  morphine (PF) 2 MG/ML injection 2 mg (has no administration in time range)  methocarbamol (ROBAXIN) tablet 500 mg (has no administration in time range)    Or  methocarbamol (ROBAXIN) 500 mg in dextrose 5 % 50 mL IVPB (has no administration in time range)  ceFAZolin (ANCEF) IVPB 2g/100 mL premix (has no administration in time range)  morphine (PF) 2 MG/ML injection 1 mg (has no administration in time range)  ondansetron (ZOFRAN) injection 4 mg (4 mg Intravenous Given 01/08/23 0200)  morphine (PF) 4 MG/ML injection 4 mg (4 mg Intravenous Given 01/08/23 0343)    (Note:  hospital course my include additional interventions and/or labs/studies not listed above.)   Strongly suspect femur fracture based on history and physical exam.  Standard lab work for hip fracture is pending as are x-rays.  Patient needs morphine 4 mg IV and Zofran to try and prevent nausea/vomiting.  Recommended n.p.o. and will follow-up with patient and family after more results are back.     Clinical Course as of 01/08/23 0405  Sun Jan 08, 2023  2831 Reassuring lab work with no acute abnormalities on metabolic panel, coagulation studies, nor CBC [CF]  0328 DG HIP UNILAT WITH PELVIS 2-3 VIEWS LEFT I viewed and interpreted the patient's hip/pelvis x-rays and can visualize a intertrochanteric femur fracture.  Confirmed by radiology report.  Will update the patient.  Consulted Dr. Hyacinth Meeker with orthopedic surgery who plans to operate on her later this morning.  Consulted Dr. Para March with the hospitalist service who will admit. [CF]  0335 Updated patient and family regarding the  plan.  Patient is still hurting so ordered another 4 mg of IV morphine [CF]    Clinical Course User Index [CF] Loleta Rose, MD     FINAL CLINICAL IMPRESSION(S) / ED DIAGNOSES   Final diagnoses:  Closed fracture of femur, intertrochanteric, left, initial encounter [facility])     Rx / DC Orders   ED Discharge Orders     None        Note:  This document was prepared using Dragon voice recognition software and may include unintentional dictation errors.  Loleta Rose, MD 01/08/23 [id number]

## 2023-01-08 NOTE — Assessment & Plan Note (Signed)
Continue metoprolol Hydralazine as needed while n.p.o.

## 2023-01-08 NOTE — Anesthesia Postprocedure Evaluation (Signed)
Anesthesia Post Note  Patient: Dorothy Glenn  Procedure(s) Performed: INTRAMEDULLARY (IM) NAIL INTERTROCHANTERIC (Left)  Patient location during evaluation: PACU Anesthesia Type: General Level of consciousness: awake and alert Pain management: pain level controlled Vital Signs Assessment: post-procedure vital signs reviewed and stable Respiratory status: spontaneous breathing, nonlabored ventilation, respiratory function stable and patient connected to nasal cannula oxygen Cardiovascular status: blood pressure returned to baseline and stable Postop Assessment: no apparent nausea or vomiting Anesthetic complications: no   No notable events documented.   Last Vitals:  Vitals:   01/08/23 1045 01/08/23 1100  BP: (!) 145/73 (!) 161/76  Pulse: 73 65  Resp: 17 14  Temp: (!) 36.3 C   SpO2: 96% 98%    Last Pain:  Vitals:   01/08/23 1045  TempSrc:   PainSc: 2                  Cleda Mccreedy Dorothy Glenn

## 2023-01-08 NOTE — Progress Notes (Signed)
Progress Note   Patient: Dorothy Glenn WNU:[id number] DOB: 05/03/1929 DOA: 01/08/2023     0 DOS: the patient was seen and examined on 01/08/2023    Same-day progress note no charge  Subjective: Patient seen and examined at bedside this morning Denies nausea vomiting abdominal pain or chest pain Left hip pain is improving  Brief hospital course: From admission HPI "Dorothy Glenn is a 89 y.o. female with medical history significant for Hearing impairment, GERD osteoarthritis s/p right knee replacement, HTN, PVD, hypothyroidism and IBS with diarrhea who presents to the ED with left hip pain following an accidental fall.  Most of the history is given by her son and daughter-in-law at the bedside with whom she resides.  Patient was in her usual state of health prior to the fall.  Patient stated to the son that was watching a game on TV when she got up to flip the light switch on and as she turned to the left she fell onto her left hip.  She did not hit her head and did not lose consciousness.  She had immediate onset of hip pain.  She had no preceding palpitations, chest pain, shortness of breath, headache, visual disturbance, though history is limited due to impaired hearing. ED course and data review: BP 182/87 on arrival, improving to 163/78 with pain control.  Other vitals unremarkable. Labs including BMP and CBC notable for hemoglobin of 11.1, baseline unknown.  INR 1.0 EKG, personally viewed and interpreted shows sinus rhythm at 78 with no acute ST-T wave changes Chest x-ray showed mild cardiomegaly and pulmonary arterial hypertension X-ray hip showed mildly displaced intertrochanteric left femur fracture The ED provider spoke with orthopedist, Dr. Hyacinth Meeker who would like to take patient to the OR later in the a.m. Patient treated with morphine Hospitalist consulted for admission.  "   Assessment and Plan: * Displaced intertrochanteric fracture of left femur (HCC) Mechanical fall History of  unspecified complication of anesthesia Status post intramedullary nailing done on 01/08/2023 Plan of care discussed with orthopedic surgeon Continue current pain regiment PT OT consulted we appreciate input Transition of care manager consulted for possible placement needs Initial x-ray before surgery showed intertrochanteric fracture of the left femur    Essential hypertension Continue metoprolol Hydralazine as needed while n.p.o.   IBS (irritable bowel syndrome) Symptomatic treatment Continue omeprazole and probiotic   Hypothyroidism Continue levothyroxine   OA s/p right knee replacement (osteoarthritis) of knee Continue current pain medications     DVT prophylaxis: SCD   Consults: Orthopedist, Dr. Hyacinth Meeker   Advance Care Planning:   Code Status: Prior    Family Communication: Son and daughter-in-law at bedside   Disposition Plan: Back to previous home environment  Data Reviewed: I have reviewed the patient's previous records, admitting H&P, orthopedics documentation, patient's CBC, CMP as well as vitals as noted below I have personally reviewed the patient's imaging of the hip noted to have displaced intertrochanteric fracture of the left femur     Latest Ref Rng & Units 01/08/2023    2:07 AM 06/11/2022    7:54 PM 03/01/2022    9:25 AM  CBC  WBC 4.0 - 10.5 K/uL 6.8  5.8  5.6   Hemoglobin 12.0 - 15.0 g/dL 03.4  74.2  59.5   Hematocrit 36.0 - 46.0 % 35.0  40.0  39.0   Platelets 150 - 400 K/uL 147  181  172        Latest Ref Rng & Units 01/08/2023  2:07 AM 06/11/2022    7:54 PM 03/01/2022    9:25 AM  CMP  Glucose 70 - 99 mg/dL 829  562  130   BUN 8 - 23 mg/dL 21  20  15    Creatinine 0.44 - 1.00 mg/dL 8.65  7.84  6.96   Sodium 135 - 145 mmol/L 138  141  142   Potassium 3.5 - 5.1 mmol/L 3.7  4.2  3.9   Chloride 98 - 111 mmol/L 105  106  111   CO2 22 - 32 mmol/L 23  26  25    Calcium 8.9 - 10.3 mg/dL 8.9  9.1  8.7   Total Protein 6.5 - 8.1 g/dL   6.6   Total  Bilirubin 0.3 - 1.2 mg/dL   0.7   Alkaline Phos 38 - 126 U/L   83   AST 15 - 41 U/L   17   ALT 0 - 44 U/L   12     Vitals:   01/08/23 1100 01/08/23 1141 01/08/23 1300 01/08/23 1400  BP: (!) 161/76 (!) 142/61 (!) 145/69 133/77  Pulse: 65 (!) 57 65 62  Resp: 14 16 14 16   Temp:  97.8 F (36.6 C) 97.7 F (36.5 C) 97.6 F (36.4 C)  TempSrc:  Oral Oral Oral  SpO2: 98% 98% 96% 98%  Weight:      Height:         Author: Loyce Dys, MD 01/08/2023 3:34 PM  For on call review [email or web address].

## 2023-01-08 NOTE — Assessment & Plan Note (Signed)
As needed pain meds 
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
## 2023-01-08 NOTE — Op Note (Signed)
DATE OF SURGERY:  01/08/2023  TIME: 10:02 AM  PATIENT NAME:  Dorothy Glenn  AGE: 89 y.o.  PRE-OPERATIVE DIAGNOSIS:  Left hip fracture intertrochanteric/base neck, displaced  POST-OPERATIVE DIAGNOSIS:  SAME  PROCEDURE:  INTRAMEDULLARY (IM) NAIL INTERTROCHANTERIC left hip  SURGEON:  Valinda Hoar  ASST:  EBL: 50 cc  COMPLICATIONS: None  OPERATIVE IMPLANTS: Synthes trochanteric femoral nail 11 mm/under 30 degree with interlocking helical blade 105 next 38 mm and distal locking screw General Mm.  PREOPERATIVE INDICATIONS:  Dorothy Glenn is a 89 y.o. year old who fell and suffered a hip fracture. She was brought into the ER and then admitted and optimized and then elected for surgical intervention.    The risks benefits and alternatives were discussed with the patient including but not limited to the risks of nonoperative treatment, versus surgical intervention including infection, bleeding, nerve injury, malunion, nonunion, hardware prominence, hardware failure, need for hardware removal, blood clots, cardiopulmonary complications, morbidity, mortality, among others, and they were willing to proceed.    OPERATIVE PROCEDURE:  The patient was brought to the operating room and placed in the supine position.  General anesthesia was administered, with out a foley. She was placed on the fracture table.  Closed reduction was performed under C-arm guidance. The length of the femur was also measured using fluoroscopy. Time out was then performed after sterile prep and drape. She received preoperative antibiotics.  Incision was made proximal to the greater trochanter. A guidewire was placed in the appropriate position. Confirmation was made on AP and lateral views. The above-named nail was opened. I opened the proximal femur with a reamer. I then placed the nail by hand easily down. I did not need to ream the femur.  Once the nail was completely seated, I placed a guidepin into the femoral head into  the center center position through a second incision.  I measured the length, and then reamed the lateral cortex and up into the head. I then placed the helical blade. Slight compression was applied. Anatomic fixation achieved. Bone quality was mediocre.  I then secured the proximal interlock.  The distal locking screw was then placed and after confirming the position of the fracture fragments and hardware I then removed the instruments, and took final C-arm pictures AP and lateral the entire length of the leg. Anatomic reconstruction was achieved, and the wounds were irrigated copiously and closed with Vicryl  followed by staples and dry sterile dressing. Sponge and needle count were correct.   The patient was awakened and returned to PACU in stable and satisfactory condition. There no complications and the patient tolerated the procedure well.  She will be partial weightbearing as tolerated, and will be on Lovenox  For DVT prophylaxis.     Valinda Hoar, M.D.

## 2023-01-08 NOTE — Assessment & Plan Note (Addendum)
Symptomatic treatment Continue omeprazole and probiotic

## 2023-01-08 NOTE — Assessment & Plan Note (Addendum)
Mechanical fall History of unspecified complication of anesthesia Dr. Hyacinth Meeker to take patient to the OR later this morning Pain control N.p.o. Further orders per orthopedist Regarding preoperative clearance: Patient at low for perioperative cardiopulmonary events, however noted to have -Pulmonary hypertension chest x-ray, but cardiopulmonary exam benign -" complication of anesthesia" seen on past medical history list.   -Will defer to anesthesiologist

## 2023-01-08 NOTE — Anesthesia Procedure Notes (Signed)
Procedure Name: Intubation Date/Time: 01/08/2023 8:26 AM  Performed by: Ginger Carne, CRNAPre-anesthesia Checklist: Patient identified, Emergency Drugs available, Suction available, Patient being monitored and Timeout performed Patient Re-evaluated:Patient Re-evaluated prior to induction Oxygen Delivery Method: Circle system utilized Preoxygenation: Pre-oxygenation with 100% oxygen Induction Type: IV induction Ventilation: Mask ventilation without difficulty Laryngoscope Size: McGraph and 3 Grade View: Grade III Tube type: Oral Tube size: 6.5 mm Number of attempts: 1 Airway Equipment and Method: Stylet and Video-laryngoscopy Placement Confirmation: ETT inserted through vocal cords under direct vision, positive ETCO2 and breath sounds checked- equal and bilateral Secured at: 21 cm Tube secured with: Tape Dental Injury: Teeth and Oropharynx as per pre-operative assessment

## 2023-01-08 NOTE — H&P (Signed)
THE PATIENT WAS SEEN PRIOR TO SURGERY TODAY.  HISTORY, ALLERGIES, HOME MEDICATIONS AND OPERATIVE PROCEDURE WERE REVIEWED. RISKS AND BENEFITS OF SURGERY DISCUSSED WITH PATIENT AGAIN.  NO CHANGES FROM INITIAL HISTORY AND PHYSICAL NOTED.    

## 2023-01-08 NOTE — Transfer of Care (Signed)
Immediate Anesthesia Transfer of Care Note  Patient: Dorothy Glenn  Procedure(s) Performed: INTRAMEDULLARY (IM) NAIL INTERTROCHANTERIC (Left)  Patient Location: PACU  Anesthesia Type:General  Level of Consciousness: awake, alert , and oriented  Airway & Oxygen Therapy: Patient Spontanous Breathing and Patient connected to nasal cannula oxygen  Post-op Assessment: Report given to RN and Post -op Vital signs reviewed and stable  Post vital signs: Reviewed and stable  Last Vitals:  Vitals Value Taken Time  BP 146/79 01/08/23 1009  Temp    Pulse 72 01/08/23 1009  Resp 12 01/08/23 1009  SpO2 92 % 01/08/23 1009    Last Pain:  Vitals:   01/08/23 0344  TempSrc:   PainSc: 8          Complications: No notable events documented.

## 2023-01-09 ENCOUNTER — Encounter: Payer: Self-pay | Admitting: Specialist

## 2023-01-09 ENCOUNTER — Inpatient Hospital Stay: Payer: Medicare Other

## 2023-01-09 DIAGNOSIS — S72142A Displaced intertrochanteric fracture of left femur, initial encounter for closed fracture: Secondary | ICD-10-CM | POA: Diagnosis not present

## 2023-01-09 LAB — CBC WITH DIFFERENTIAL/PLATELET
Abs Immature Granulocytes: 0.06 10*3/uL (ref 0.00–0.07)
Basophils Absolute: 0 10*3/uL (ref 0.0–0.1)
Basophils Relative: 0 %
Eosinophils Absolute: 0 10*3/uL (ref 0.0–0.5)
Eosinophils Relative: 0 %
HCT: 29 % — ABNORMAL LOW (ref 36.0–46.0)
Hemoglobin: 9.7 g/dL — ABNORMAL LOW (ref 12.0–15.0)
Immature Granulocytes: 1 %
Lymphocytes Relative: 8 %
Lymphs Abs: 0.8 10*3/uL (ref 0.7–4.0)
MCH: 29.8 pg (ref 26.0–34.0)
MCHC: 33.4 g/dL (ref 30.0–36.0)
MCV: 89 fL (ref 80.0–100.0)
Monocytes Absolute: 0.7 10*3/uL (ref 0.1–1.0)
Monocytes Relative: 6 %
Neutro Abs: 8.8 10*3/uL — ABNORMAL HIGH (ref 1.7–7.7)
Neutrophils Relative %: 85 %
Platelets: 144 10*3/uL — ABNORMAL LOW (ref 150–400)
RBC: 3.26 MIL/uL — ABNORMAL LOW (ref 3.87–5.11)
RDW: 13.8 % (ref 11.5–15.5)
WBC: 10.3 10*3/uL (ref 4.0–10.5)
nRBC: 0 % (ref 0.0–0.2)

## 2023-01-09 LAB — BASIC METABOLIC PANEL
Anion gap: 6 (ref 5–15)
BUN: 16 mg/dL (ref 8–23)
CO2: 26 mmol/L (ref 22–32)
Calcium: 8.3 mg/dL — ABNORMAL LOW (ref 8.9–10.3)
Chloride: 104 mmol/L (ref 98–111)
Creatinine, Ser: 0.99 mg/dL (ref 0.44–1.00)
GFR, Estimated: 54 mL/min — ABNORMAL LOW (ref >60)
Glucose, Bld: 127 mg/dL — ABNORMAL HIGH (ref 70–99)
Potassium: 4 mmol/L (ref 3.5–5.1)
Sodium: 136 mmol/L (ref 135–145)

## 2023-01-09 NOTE — Progress Notes (Signed)
Initial Nutrition Assessment  DOCUMENTATION CODES:   Obesity unspecified  INTERVENTION:   -Continue regular diet -MVI with minerals daily  NUTRITION DIAGNOSIS:   Increased nutrient needs related to post-op healing as evidenced by estimated needs.  GOAL:   Patient will meet greater than or equal to 90% of their needs  MONITOR:   PO intake  REASON FOR ASSESSMENT:   Consult Assessment of nutrition requirement/status, Hip fracture protocol  ASSESSMENT:   Pt with medical history significant for Hearing impairment, GERD osteoarthritis s/p right knee replacement, HTN, PVD, hypothyroidism and IBS with diarrhea who presents with left hip pain following an accidental fall.  Pt admitted with lt femur fracture.   8/18- s/p PROCEDURE:  INTRAMEDULLARY (IM) NAIL INTERTROCHANTERIC left hip   Reviewed I/O's: +1.2 L x 24 hours  UOP: 550 ml x 24 hours   Spoke with pt and family members at bedside. Pt shares that she is feeling better today and made good progress with therapy. Pt eating lunch at time of visit; no difficulty noted. Meal completions 90%.   Per family, pt consumes 3 meals and 3 snacks daily. Her favorite food is rice krispie treats. Family has also been bringing in favorite foods.   Pt denies any weight loss. Reviewed wt; noted wt has been consistent since 03/2017. Suspect wt from 06/11/22 is an outlier.   Discussed importance of good meal intake to promote healing. RD took meal orders and updated food preferences.   Plan to d/c to SNF once medically stable.   Medications reviewed and include senokot and 0.45% sodium chloride infusion @ 75 ml/hr.   Labs reviewed.    NUTRITION - FOCUSED PHYSICAL EXAM:  Flowsheet Row Most Recent Value  Orbital Region No depletion  Upper Arm Region Mild depletion  Thoracic and Lumbar Region No depletion  Buccal Region No depletion  Temple Region No depletion  Clavicle Bone Region No depletion  Clavicle and Acromion Bone Region No  depletion  Scapular Bone Region No depletion  Dorsal Hand Mild depletion  Patellar Region No depletion  Anterior Thigh Region No depletion  Posterior Calf Region No depletion  Edema (RD Assessment) Moderate  Hair Reviewed  Eyes Reviewed  Mouth Reviewed  Skin Reviewed  Nails Reviewed       Diet Order:   Diet Order             Diet regular Room service appropriate? Yes; Fluid consistency: Thin  Diet effective now                   EDUCATION NEEDS:   Education needs have been addressed  Skin:  Skin Assessment: Skin Integrity Issues: Skin Integrity Issues:: Incisions Incisions: clsoed lt hip  Last BM:  Unknown  Height:   Ht Readings from Last 1 Encounters:  01/08/23 5\' 8"  (1.727 m)    Weight:   Wt Readings from Last 1 Encounters:  01/08/23 90.7 kg    Ideal Body Weight:  63.6 kg  BMI:  Body mass index is 30.41 kg/m.  Estimated Nutritional Needs:   Kcal:  1700-1900  Protein:  90-105 grams  Fluid:  > 1.7 L    Levada Schilling, RD, LDN, CDCES Registered Dietitian II Certified Diabetes Care and Education Specialist Please refer to [facility] for RD and/or RD on-call/weekend/after hours pager

## 2023-01-09 NOTE — Evaluation (Addendum)
I have read and reviewed the following documentation and am in agreement with the information.  This licensed clinician was present and actively directing the care throughout the session at all times.  Aleda Grana, PT, DPT 12:24 PM,01/09/23   Physical Therapy Evaluation Patient Details Name: Dorothy Glenn MRN: [id number] DOB: 23-Oct-1928 Today's Date: 01/09/2023  History of Present Illness  Patient is a 89 y.o. female with medical history significant for Hearing impairment, GERD osteoarthritis s/p right knee replacement, HTN, PVD, partial R eye blindiness, Hep A, hypothyroidism and IBS with diarrhea who presents to the ED with left hip pain following an accidental fall. Current MD assessment includes: Displaced intertrochanteric fracture of left femur secondary to fall; now s/p IM nailing of Left femur.  Clinical Impression  Pt was pleasant and motivated to participate during the session and put forth good effort throughout. Pt found supine in bed with family present. Pt is currently Min A for bed mobility and transfers, mainly for assist with LLE management. Pt able to stand with RW and Min A for initial boost. Pt able to follow WB precautions during amb in room, able to walk x2 5 feet with RW and Min A for initial few steps progressing to CGA; seated rest between bouts. Second bout ended due to pt's fatigue and a slight increase in pain. Pt will benefit from continued PT services upon discharge to safely address deficits listed in patient problem list for decreased caregiver assistance and eventual return to PLOF.          If plan is discharge home, recommend the following: A little help with walking and/or transfers;A little help with bathing/dressing/bathroom;Assist for transportation;Help with stairs or ramp for entrance   Can travel by private vehicle        Equipment Recommendations Rolling walker (2 wheels);BSC/3in1  Recommendations for Other Services       Functional Status  Assessment Patient has had a recent decline in their functional status and demonstrates the ability to make significant improvements in function in a reasonable and predictable amount of time.     Precautions / Restrictions Precautions Precautions: Fall;Other (comment) Precaution Booklet Issued: Yes (comment) Precaution Comments: HEP packet Restrictions Weight Bearing Restrictions: Yes LLE Weight Bearing: Partial weight bearing LLE Partial Weight Bearing Percentage or Pounds: 50%      Mobility  Bed Mobility Overal bed mobility: Needs Assistance Bed Mobility: Supine to Sit     Supine to sit: Min assist     General bed mobility comments: Min A to help lift L leg    Transfers Overall transfer level: Needs assistance Equipment used: Rolling walker (2 wheels) Transfers: Sit to/from Stand Sit to Stand: Min assist           General transfer comment: x2 attempts to sit from EOB, unable to first attempt. second attempt provided cues for safety and proper sequencing, pt was able to stand with RW at [facility] A for inital boost    Ambulation/Gait Ambulation/Gait assistance: Min assist-CGA Gait Distance (Feet): 10 Feet (5 feet x2) Assistive device: Rolling walker (2 wheels) Gait Pattern/deviations: Step-to pattern, Decreased step length - right, Decreased step length - left, Decreased stance time - left Gait velocity: decreased     General Gait Details: Cues for PWB sequencing with RW, pt only requiring light VC's for corrections  Stairs            Wheelchair Mobility     Tilt Bed    Modified Rankin (Stroke Patients Only)  Balance Overall balance assessment: Needs assistance Sitting-balance support: No upper extremity supported, Feet supported Sitting balance-Leahy Scale: Good       Standing balance-Leahy Scale: Fair Standing balance comment: Static standing at RW                             Pertinent Vitals/Pain Pain Assessment Pain  Assessment: Faces Faces Pain Scale: Hurts a little bit Pain Location: L knee, towards end of session, pain moving slightly towards L hip Pain Descriptors / Indicators: Sore Pain Intervention(s): Monitored during session    Home Living Family/patient expects to be discharged to:: Private residence Living Arrangements: Children Available Help at Discharge: Family Type of Home: House Home Access: Stairs to enter Entrance Stairs-Rails: None Entrance Stairs-Number of Steps: 2   Home Layout: One level Home Equipment: Grab bars - tub/shower;Cane - single point      Prior Function Prior Level of Function : Independent/Modified Independent             Mobility Comments: Household ambulator, uses no AD baseline, but holds on to walls/furniture when walking. ADLs Comments: Ind with bathing, toileting, self selected daily chores     Extremity/Trunk Assessment   Upper Extremity Assessment Upper Extremity Assessment: Overall WFL for tasks assessed    Lower Extremity Assessment Lower Extremity Assessment: Generalized weakness;LLE deficits/detail LLE Deficits / Details: L Knee weakness/pain       Communication   Communication Communication: Hearing impairment Cueing Techniques: Tactile cues;Verbal cues;Gestural cues  Cognition Arousal: Alert Behavior During Therapy: WFL for tasks assessed/performed Overall Cognitive Status: Within Functional Limits for tasks assessed                                          General Comments      Exercises Total Joint Exercises Ankle Circles/Pumps: AROM, Strengthening, Both, 10 reps Gluteal Sets: AROM, Strengthening, Both, 10 reps Short Arc Quad: AROM, Strengthening, Left, 10 reps Heel Slides: AAROM, Strengthening, Left, 10 reps Hip ABduction/ADduction: AAROM, Strengthening, Left, 10 reps Straight Leg Raises: AAROM, Strengthening, Left, 10 reps Long Arc Quad: AROM, Strengthening, Left, 10 reps   Assessment/Plan    PT  Assessment Patient needs continued PT services  PT Problem List Decreased strength;Decreased range of motion;Decreased activity tolerance;Decreased balance;Decreased mobility;Decreased knowledge of use of DME       PT Treatment Interventions DME instruction;Gait training;Stair training;Functional mobility training;Therapeutic activities;Therapeutic exercise    PT Goals (Current goals can be found in the Care Plan section)  Acute Rehab PT Goals Patient Stated Goal: get back to doing puzzles PT Goal Formulation: With patient Time For Goal Achievement: 01/22/23 Potential to Achieve Goals: Good    Frequency BID     Co-evaluation               AM-PAC PT "6 Clicks" Mobility  Outcome Measure Help needed turning from your back to your side while in a flat bed without using bedrails?: A Little Help needed moving from lying on your back to sitting on the side of a flat bed without using bedrails?: A Little Help needed moving to and from a bed to a chair (including a wheelchair)?: A Little Help needed standing up from a chair using your arms (e.g., wheelchair or bedside chair)?: A Little Help needed to walk in hospital room?: A Little Help needed climbing 3-5 steps with  a railing? : A Lot 6 Click Score: 17    End of Session Equipment Utilized During Treatment: Gait belt Activity Tolerance: Patient tolerated treatment well;Patient limited by fatigue Patient left: in chair;with family/visitor present;with chair alarm set;with call bell/phone within reach;with SCD's reapplied Nurse Communication: Mobility status PT Visit Diagnosis: Unsteadiness on feet (R26.81);Muscle weakness (generalized) (M62.81);Pain Pain - Right/Left: Left Pain - part of body: Knee;Leg    Time: 1610-9604 PT Time Calculation (min) (ACUTE ONLY): 47 min   Charges:                 Cecile Sheerer, SPT 01/09/23, 12:23 PM

## 2023-01-09 NOTE — Evaluation (Signed)
Occupational Therapy Evaluation Patient Details Name: Dorothy Glenn MRN: [id number] DOB: 08-26-1928 Today's Date: 01/09/2023   History of Present Illness Patient is a 89 y.o. female with medical history significant for Hearing impairment, GERD osteoarthritis s/p right knee replacement, HTN, PVD, partial R eye blindiness, Hep A, hypothyroidism and IBS with diarrhea who presents to the ED with left hip pain following an accidental fall. Current MD assessment includes: Displaced intertrochanteric fracture of left femur secondary to fall; now s/p IM nailing of Left femur.   Clinical Impression   Patient presenting with decreased Ind in self care,balance, functional mobility/transfers, endurance, and safety awareness. Patient reports being Mod I at baseline and living at home with children.Patient currently functioning mod A for bed mobility to come to EOB. Mod A to stand from standard bed height with RW and mod multimodal cues for technique and hand placement throughout session. Pt stands and takes steps to [facility] with min A and use of RW to practice transfer. Pt does not need to void at this time. Pt's hospital gown and chux pad changed while pt seated on BSC. Pt stands from comfort height BSC with min A and takes steps back to bed in same manner as above. Sit >supine with mod A for B LEs and to guide UB to bed safely. Call bell and all needed items within reach.  Patient will benefit from acute OT to increase overall independence in the areas of ADLs, functional mobility, and safety awareness in order to safely discharge.      If plan is discharge home, recommend the following: A lot of help with walking and/or transfers;A lot of help with bathing/dressing/bathroom;Assistance with cooking/housework;Assist for transportation;Help with stairs or ramp for entrance    Functional Status Assessment  Patient has had a recent decline in their functional status and demonstrates the ability to make significant  improvements in function in a reasonable and predictable amount of time.  Equipment Recommendations  Other (comment);BSC/3in1       Precautions / Restrictions Precautions Precautions: Fall Precaution Booklet Issued: Yes (comment) Precaution Comments: HEP packet Restrictions Weight Bearing Restrictions: Yes LLE Weight Bearing: Partial weight bearing LLE Partial Weight Bearing Percentage or Pounds: 50%      Mobility Bed Mobility Overal bed mobility: Needs Assistance Bed Mobility: Supine to Sit, Sit to Supine     Supine to sit: Min assist Sit to supine: Mod assist        Transfers Overall transfer level: Needs assistance Equipment used: Rolling walker (2 wheels) Transfers: Sit to/from Stand, Bed to chair/wheelchair/BSC Sit to Stand: Min assist, Mod assist     Step pivot transfers: Min assist            Balance Overall balance assessment: Needs assistance Sitting-balance support: No upper extremity supported, Feet supported Sitting balance-Leahy Scale: Good     Standing balance support: Reliant on assistive device for balance, During functional activity, Bilateral upper extremity supported Standing balance-Leahy Scale: Fair                             ADL either performed or assessed with clinical judgement   ADL Overall ADL's : Needs assistance/impaired                         Toilet Transfer: Moderate assistance;Rolling walker (2 wheels);Ambulation;BSC/3in1   Toileting- Architect and Hygiene: Moderate assistance;Sit to/from stand  Vision Patient Visual Report: No change from baseline              Pertinent Vitals/Pain Pain Assessment Pain Assessment: Faces Faces Pain Scale: Hurts a little bit Pain Location: L LE Pain Descriptors / Indicators: Sore, Discomfort Pain Intervention(s): Monitored during session, Repositioned     Extremity/Trunk Assessment Upper Extremity Assessment Upper Extremity  Assessment: Overall WFL for tasks assessed   Lower Extremity Assessment Lower Extremity Assessment: Generalized weakness;LLE deficits/detail LLE Deficits / Details: L Knee weakness/pain       Communication Communication Communication: Hearing impairment Cueing Techniques: Verbal cues;Tactile cues   Cognition Arousal: Alert Behavior During Therapy: WFL for tasks assessed/performed Overall Cognitive Status: Within Functional Limits for tasks assessed                                                  Home Living Family/patient expects to be discharged to:: Private residence Living Arrangements: Children Available Help at Discharge: Family;Available PRN/intermittently Type of Home: House Home Access: Stairs to enter [employer] of Steps: 2 Entrance Stairs-Rails: None Home Layout: One level     Bathroom Shower/Tub: Producer, television/film/video: Handicapped height Bathroom Accessibility: Yes   Home Equipment: Grab bars - tub/shower;Cane - single point;Shower seat          Prior Functioning/Environment Prior Level of Function : Independent/Modified Independent             Mobility Comments: Household ambulator, uses no AD baseline, but holds on to walls/furniture when walking. ADLs Comments: Ind with bathing, toileting, self selected daily chores        OT Problem List: Decreased strength;Decreased activity tolerance;Impaired balance (sitting and/or standing);Decreased knowledge of use of DME or AE;Decreased safety awareness;Pain      OT Treatment/Interventions: Self-care/ADL training;Therapeutic exercise;Therapeutic activities;Energy conservation;DME and/or AE instruction;Patient/family education;Balance training    OT Goals(Current goals can be found in the care plan section) Acute Rehab OT Goals Patient Stated Goal: to get stronger OT Goal Formulation: With patient/family Time For Goal Achievement: 01/23/23 Potential to  Achieve Goals: Fair ADL Goals Pt Will Perform Grooming: (P) with supervision;standing Pt Will Perform Lower Body Dressing: (P) with min assist;sit to/from stand Pt Will Transfer to Toilet: (P) with min assist;ambulating Pt Will Perform Toileting - Clothing Manipulation and hygiene: (P) with min assist;sit to/from stand  OT Frequency: Min 1X/week       AM-PAC OT "6 Clicks" Daily Activity     Outcome Measure Help from another person eating meals?: None Help from another person taking care of personal grooming?: None Help from another person toileting, which includes using toliet, bedpan, or urinal?: A Lot Help from another person bathing (including washing, rinsing, drying)?: A Lot Help from another person to put on and taking off regular upper body clothing?: A Little Help from another person to put on and taking off regular lower body clothing?: A Lot 6 Click Score: 17   End of Session Equipment Utilized During Treatment: Rolling walker (2 wheels) Nurse Communication: Mobility status  Activity Tolerance: Patient tolerated treatment well Patient left: in bed;with call bell/phone within reach;with bed alarm set  OT Visit Diagnosis: Unsteadiness on feet (R26.81);Repeated falls (R29.6);Muscle weakness (generalized) (M62.81)                Time: 1610-9604 OT Time Calculation (min): 25 min Charges:  OT General Charges $OT Visit: 1 Visit OT Evaluation $OT Eval Moderate Complexity: 1 Mod OT Treatments $Self Care/Home Management : 8-22 mins  Jackquline Denmark, MS, OTR/L , CBIS ascom [phone]  01/09/23, 11:47 AM

## 2023-01-09 NOTE — Progress Notes (Signed)
Subjective: 1 Day Post-Op Procedure(s) (LRB): INTRAMEDULLARY (IM) NAIL INTERTROCHANTERIC (Left) The patient is alert and sitting in the chair.  She appears comfortable.  She complains more of left knee pain than hip pain.  Dressing is dry.  Hemoglobin is 9.7.  X-rays of the left knee show advanced arthritis but no acute injury.  Family is at the bedside.  Patient reports pain as mild.  Objective:   VITALS:   Vitals:   01/09/23 0414 01/09/23 0722  BP: (!) 122/51 (!) 128/57  Pulse: 67 69  Resp: 20 16  Temp: 98.4 F (36.9 C) 98.3 F (36.8 C)  SpO2: 99% 97%    Neurologically intact Sensation intact distally Dorsiflexion/Plantar flexion intact Incision: dressing C/D/I  LABS Recent Labs    01/08/23 0207 01/09/23 0327  HGB 11.1* 9.7*  HCT 35.0* 29.0*  WBC 6.8 10.3  PLT 147* 144*    Recent Labs    01/08/23 0207 01/09/23 0327  NA 138 136  K 3.7 4.0  BUN 21 16  CREATININE 0.90 0.99  GLUCOSE 108* 127*    Recent Labs    01/08/23 0207  INR 1.0     Assessment/Plan: 1 Day Post-Op Procedure(s) (LRB): INTRAMEDULLARY (IM) NAIL INTERTROCHANTERIC (Left)   Advance diet Up with therapy Discharge to SNF when stable. Ice and Ace bandage to the left knee.

## 2023-01-09 NOTE — Plan of Care (Signed)
  Problem: Education: Goal: Knowledge of General Education information will improve Description Including pain rating scale, medication(s)/side effects and non-pharmacologic comfort measures Outcome: Progressing   

## 2023-01-09 NOTE — Progress Notes (Signed)
Earlier on shift, pt's daughter in law and son at bedside requested for IVFs to be discontinued, IVFs were stopped. During change of shift, another family member is questioning why the IVFs were stopped, family educated that per order, if pt is eating and drinking, IVFs can be discontinued. Night shift nurse Victorino Dike made aware.

## 2023-01-09 NOTE — Progress Notes (Signed)
Physical Therapy Treatment Patient Details Name: Dorothy Glenn MRN: [id number] DOB: [birth year] Today's Date: 01/09/2023   History of Present Illness Patient is a 89 y.o. female with medical history significant for Hearing impairment, GERD osteoarthritis s/p right knee replacement, HTN, PVD, partial R eye blindiness, Hep A, hypothyroidism and IBS with diarrhea who presents to the ED with left hip pain following an accidental fall. Current MD assessment includes: Displaced intertrochanteric fracture of left femur secondary to fall; now s/p IM nailing of Left femur.    PT Comments  Pt was pleasant and motivated to participate during the session and put forth good effort throughout. Pt found supine in bed. Pt continues to be Min A for bed mobility and transfers but is requiring less VC's for safe setup and positioning. Pt amb 15 feet x2 with RW and CGA with a seated rest break between bouts. Pt denies increases in pain during activity today and only being limited by fatigue. Per pt's son the distances pt walked today is similar to her home ambulatory distances. Pt showing good carryover with WB precautions and cues for safety throughout session. Pt will benefit from continued PT services upon discharge to safely address deficits listed in patient problem list for decreased caregiver assistance and eventual return to PLOF.    If plan is discharge home, recommend the following: A little help with walking and/or transfers;A little help with bathing/dressing/bathroom;Assist for transportation;Help with stairs or ramp for entrance   Can travel by private vehicle        Equipment Recommendations  Rolling walker (2 wheels);BSC/3in1    Recommendations for Other Services       Precautions / Restrictions Precautions Precautions: Fall Restrictions Weight Bearing Restrictions: Yes LLE Weight Bearing: Partial weight bearing LLE Partial Weight Bearing Percentage or Pounds: 50%     Mobility  Bed  Mobility Overal bed mobility: Needs Assistance Bed Mobility: Supine to Sit, Sit to Supine     Supine to sit: Min assist     General bed mobility comments: Min A to help lift L leg    Transfers Overall transfer level: Needs assistance Equipment used: Rolling walker (2 wheels) Transfers: Sit to/from Stand Sit to Stand: Min assist           General transfer comment: x2 attempts to sit from EOB, unable to first attempt. second attempt provided cues for safety and proper sequencing, pt was able to stand with RW at [facility] A for inital boost    Ambulation/Gait Ambulation/Gait assistance: Min assist Gait Distance (Feet): 15 Feet Assistive device: Rolling walker (2 wheels) Gait Pattern/deviations: Step-to pattern, Decreased step length - right, Decreased step length - left, Decreased stance time - left Gait velocity: decreased     General Gait Details: Pt showing good carryover regarding PWB cues from session earlier today.   Stairs             Wheelchair Mobility     Tilt Bed    Modified Rankin (Stroke Patients Only)       Balance Overall balance assessment: Needs assistance Sitting-balance support: No upper extremity supported, Feet supported Sitting balance-Leahy Scale: Good     Standing balance support: Reliant on assistive device for balance, During functional activity, Bilateral upper extremity supported Standing balance-Leahy Scale: Fair Standing balance comment: Static standing at [employer] Arousal: Alert Behavior  During Therapy: WFL for tasks assessed/performed Overall Cognitive Status: Within Functional Limits for tasks assessed                                          Exercises Total Joint Exercises Ankle Circles/Pumps: AROM, Strengthening, Both, 10 reps Gluteal Sets: AROM, Strengthening, Both, 10 reps Short Arc Quad: AROM, Strengthening, Left, 10 reps Heel Slides: AAROM,  Strengthening, Left, 10 reps Hip ABduction/ADduction: AAROM, Strengthening, Left, 10 reps Straight Leg Raises: AAROM, Strengthening, Left, 10 reps Long Arc Quad: AROM, Strengthening, Left, 10 reps    General Comments        Pertinent Vitals/Pain Pain Assessment Pain Assessment: Faces Faces Pain Scale: Hurts a little bit Pain Location: L LE Pain Descriptors / Indicators: Sore, Discomfort Pain Intervention(s): Monitored during session    Home Living Family/patient expects to be discharged to:: Private residence Living Arrangements: Children Available Help at Discharge: Family;Available PRN/intermittently Type of Home: House Home Access: Stairs to enter Entrance Stairs-Rails: None Entrance Stairs-Number of Steps: 2   Home Layout: One level Home Equipment: Grab bars - tub/shower;Cane - single point;Shower seat      Prior Function            PT Goals (current goals can now be found in the care plan section) Progress towards PT goals: Progressing toward goals    Frequency    BID      PT Plan      Co-evaluation              AM-PAC PT "6 Clicks" Mobility   Outcome Measure  Help needed turning from your back to your side while in a flat bed without using bedrails?: A Little Help needed moving from lying on your back to sitting on the side of a flat bed without using bedrails?: A Little Help needed moving to and from a bed to a chair (including a wheelchair)?: A Little Help needed standing up from a chair using your arms (e.g., wheelchair or bedside chair)?: A Little Help needed to walk in hospital room?: A Little Help needed climbing 3-5 steps with a railing? : A Lot 6 Click Score: 17    End of Session Equipment Utilized During Treatment: Gait belt Activity Tolerance: Patient tolerated treatment well;Patient limited by fatigue Patient left: in chair;with family/visitor present;with chair alarm set;with call bell/phone within reach Nurse Communication:  Mobility status PT Visit Diagnosis: Unsteadiness on feet (R26.81);Muscle weakness (generalized) (M62.81);Pain Pain - Right/Left: Left Pain - part of body: Knee;Leg     Time: 1308-6578 PT Time Calculation (min) (ACUTE ONLY): 20 min  Charges:    $Gait Training: 8-22 mins $Therapeutic Exercise: 8-22 mins PT General Charges $$ ACUTE PT VISIT: 1 Visit                     Cecile Sheerer, SPT 01/09/23, 3:04 PM

## 2023-01-09 NOTE — Progress Notes (Addendum)
Progress Note   Patient: Dorothy Glenn ZOX:[id number] DOB: [birth year] DOA: 01/08/2023     1 DOS: the patient was seen and examined on 01/09/2023    Subjective: Patient seen and examined at bedside this morning Denies nausea vomiting abdominal pain no chest pain Family wishes for patient to go to rehab I have discussed this with transition of care manager   Brief hospital course: From admission HPI "SUNIYA HAUGHEY is a 89 y.o. female with medical history significant for Hearing impairment, GERD osteoarthritis s/p right knee replacement, HTN, PVD, hypothyroidism and IBS with diarrhea who presents to the ED with left hip pain following an accidental fall.  Most of the history is given by her son and daughter-in-law at the bedside with whom she resides.  Patient was in her usual state of health prior to the fall.  Patient stated to the son that was watching a game on TV when she got up to flip the light switch on and as she turned to the left she fell onto her left hip.  She did not hit her head and did not lose consciousness.  She had immediate onset of hip pain.  She had no preceding palpitations, chest pain, shortness of breath, headache, visual disturbance, though history is limited due to impaired hearing. ED course and data review: BP 182/87 on arrival, improving to 163/78 with pain control.  Other vitals unremarkable. Labs including BMP and CBC notable for hemoglobin of 11.1, baseline unknown.  INR 1.0 EKG, personally viewed and interpreted shows sinus rhythm at 78 with no acute ST-T wave changes Chest x-ray showed mild cardiomegaly and pulmonary arterial hypertension X-ray hip showed mildly displaced intertrochanteric left femur fracture The ED provider spoke with orthopedist, Dr. Hyacinth Meeker who would like to take patient to the OR later in the a.m. Patient treated with morphine Hospitalist consulted for admission.  "   Assessment and Plan: * Displaced intertrochanteric fracture of left femur  (HCC) Mechanical fall History of unspecified complication of anesthesia Status post intramedullary nailing done on 01/08/2023 Plan of care discussed with orthopedic surgeon Continue current pain regiment Continue PT OT Transition of care manager consulted for possible placement needs Initial x-ray before surgery showed intertrochanteric fracture of the left femur Plan of care discussed with orthopedic surgeon   Essential hypertension Continue metoprolol And hydralazine as needed   IBS (irritable bowel syndrome) Symptomatic treatment Continue omeprazole and probiotic   Hypothyroidism continue levothyroxine   Anemia likely secondary to anemia of chronic disease as well as postoperative anemia Hemoglobin dropped from 11-9.7 We will continue to monitor closely No indication for blood transfusion at this time  OA s/p right knee replacement (osteoarthritis) of knee Continue current pain medications     DVT prophylaxis: SCD   Consults: Orthopedist, Dr. Hyacinth Meeker   Advance Care Planning:   Code Status: Prior    Family Communication: Son and daughter-in-law at bedside   Disposition Plan: Back to previous home environment   Data Reviewed: I have reviewed patient's previous records, admitting H&P, orthopedics documentation, patient's CBC, CMP as well as vitals as noted below I have personally reviewed the patient's imaging of the hip noted to have displaced intertrochanteric fracture of the left femur     Vitals:   01/08/23 1954 01/08/23 2347 01/09/23 0414 01/09/23 0722  BP: 134/64 (!) 137/55 (!) 122/51 (!) 128/57  Pulse: 61 64 67 69  Resp: 16 16 20 16   Temp: 97.8 F (36.6 C) 98.4 F (36.9 C) 98.4 F (36.9  C) 98.3 F (36.8 C)  TempSrc:      SpO2: 99% 100% 99% 97%  Weight:      Height:         Author: Loyce Dys, MD 01/09/2023 5:20 PM  For on call review [email or web address].

## 2023-01-10 DIAGNOSIS — S72142A Displaced intertrochanteric fracture of left femur, initial encounter for closed fracture: Secondary | ICD-10-CM | POA: Diagnosis not present

## 2023-01-10 LAB — BASIC METABOLIC PANEL
Anion gap: 8 (ref 5–15)
BUN: 21 mg/dL (ref 8–23)
CO2: 23 mmol/L (ref 22–32)
Calcium: 8.4 mg/dL — ABNORMAL LOW (ref 8.9–10.3)
Chloride: 105 mmol/L (ref 98–111)
Creatinine, Ser: 0.88 mg/dL (ref 0.44–1.00)
GFR, Estimated: >60 mL/min (ref >60)
Glucose, Bld: 97 mg/dL (ref 70–99)
Potassium: 3.6 mmol/L (ref 3.5–5.1)
Sodium: 136 mmol/L (ref 135–145)

## 2023-01-10 LAB — CBC WITH DIFFERENTIAL/PLATELET
Abs Immature Granulocytes: 0.04 10*3/uL (ref 0.00–0.07)
Basophils Absolute: 0 10*3/uL (ref 0.0–0.1)
Basophils Relative: 0 %
Eosinophils Absolute: 0.1 10*3/uL (ref 0.0–0.5)
Eosinophils Relative: 2 %
HCT: 26.8 % — ABNORMAL LOW (ref 36.0–46.0)
Hemoglobin: 9.1 g/dL — ABNORMAL LOW (ref 12.0–15.0)
Immature Granulocytes: 1 %
Lymphocytes Relative: 17 %
Lymphs Abs: 1.2 10*3/uL (ref 0.7–4.0)
MCH: 30.1 pg (ref 26.0–34.0)
MCHC: 34 g/dL (ref 30.0–36.0)
MCV: 88.7 fL (ref 80.0–100.0)
Monocytes Absolute: 0.6 10*3/uL (ref 0.1–1.0)
Monocytes Relative: 9 %
Neutro Abs: 5.1 10*3/uL (ref 1.7–7.7)
Neutrophils Relative %: 71 %
Platelets: 129 10*3/uL — ABNORMAL LOW (ref 150–400)
RBC: 3.02 MIL/uL — ABNORMAL LOW (ref 3.87–5.11)
RDW: 14.1 % (ref 11.5–15.5)
WBC: 7.1 10*3/uL (ref 4.0–10.5)
nRBC: 0 % (ref 0.0–0.2)

## 2023-01-10 MED ORDER — LIDOCAINE HCL (PF) 1 % IJ SOLN
30.0000 mL | Freq: Once | INTRAMUSCULAR | Status: AC
  Administered 2023-01-10: 30 mL via INTRADERMAL
  Filled 2023-01-10: qty 30

## 2023-01-10 MED ORDER — TRIAMCINOLONE ACETONIDE 40 MG/ML IJ SUSP
40.0000 mg | Freq: Once | INTRAMUSCULAR | Status: AC
  Administered 2023-01-10: 40 mg via INTRA_ARTICULAR
  Filled 2023-01-10: qty 1

## 2023-01-10 NOTE — Progress Notes (Signed)
Physical Therapy Treatment Patient Details Name: Dorothy Glenn MRN: [id number] DOB: November 29, 1928 Today's Date: 01/10/2023   History of Present Illness Patient is a 89 y.o. female with medical history significant for Hearing impairment, GERD osteoarthritis s/p right knee replacement, HTN, PVD, partial R eye blindiness, Hep A, hypothyroidism and IBS with diarrhea who presents to the ED with left hip pain following an accidental fall. Current MD assessment includes: Displaced intertrochanteric fracture of left femur secondary to fall; now s/p IM nailing of Left femur.    PT Comments  Pt was pleasant and motivated to participate during the session and put forth good effort throughout. Pt found supine in bed, willing to participate in afternoon session. Pt was min A with increased time for supine to sit, and Mod A +2 for sit to supine. Pt performed multiple STS from varying heights and surfaces, current Min A from EOB with RW, and CGA with RW from recliner, cues given for hand placement when transfering throughout session. Pt amb 10 feet x3 with RW from recliner to opposite wall at CGA level and chair follow, once reaching the opposite wall pt sat and would restart from beginning. Pt will benefit from continued PT services upon discharge to safely address deficits listed in patient problem list for decreased caregiver assistance and eventual return to PLOF.    If plan is discharge home, recommend the following: A little help with walking and/or transfers;A little help with bathing/dressing/bathroom;Assist for transportation;Help with stairs or ramp for entrance;Assistance with cooking/housework   Can travel by private vehicle     Yes  Equipment Recommendations  Other (comment) (TBD at next venue)    Recommendations for Other Services       Precautions / Restrictions Precautions Precautions: Fall Precaution Booklet Issued: Yes (comment) Precaution Comments: HEP packet Restrictions Weight Bearing  Restrictions: Yes LLE Weight Bearing: Partial weight bearing LLE Partial Weight Bearing Percentage or Pounds: 50%     Mobility  Bed Mobility Overal bed mobility: Needs Assistance Bed Mobility: Supine to Sit     Supine to sit: HOB elevated, Used rails, Min assist Sit to supine: Mod assist, +2 for physical assistance   General bed mobility comments: cues provided for safety and sequencing    Transfers Overall transfer level: Needs assistance Equipment used: Rolling walker (2 wheels) Transfers: Sit to/from Stand Sit to Stand: Min assist, From elevated surface, Contact guard assist           General transfer comment: Min A from elevated bed, CGA from recliner, cues for hand placement    Ambulation/Gait Ambulation/Gait assistance: Contact guard assist Gait Distance (Feet): 10 Feet Assistive device: Rolling walker (2 wheels) Gait Pattern/deviations: Step-to pattern, Decreased step length - right, Decreased step length - left, Decreased stance time - left, Antalgic Gait velocity: decreased     General Gait Details: Amb from recliner to wall with chair follow, sititng down once at wall and restarting from opposite wall x3, Intermittent cues for [facility] sequencing    Stairs             Wheelchair Mobility     Tilt Bed    Modified Rankin (Stroke Patients Only)       Balance Overall balance assessment: Needs assistance Sitting-balance support: Feet supported, Single extremity supported Sitting balance-Leahy Scale: Fair     Standing balance support: Reliant on assistive device for balance, During functional activity, Bilateral upper extremity supported Standing balance-Leahy Scale: Poor Standing balance comment: Static standing  Cognition Arousal: Alert Behavior During Therapy: WFL for tasks assessed/performed Overall Cognitive Status: Within Functional Limits for tasks assessed                                  General Comments:         Exercises     General Comments        Pertinent Vitals/Pain Pain Assessment Pain Assessment: Faces Pain Score: 8  Faces Pain Scale: Hurts little more Pain Location: L LE Pain Descriptors / Indicators: Discomfort, Aching, Grimacing Pain Intervention(s): Monitored during session, Limited activity within patient's tolerance    Home Living                          Prior Function            PT Goals (current goals can now be found in the care plan section) Progress towards PT goals: Progressing toward goals    Frequency    BID      PT Plan      Co-evaluation              AM-PAC PT "6 Clicks" Mobility   Outcome Measure  Help needed turning from your back to your side while in a flat bed without using bedrails?: A Little Help needed moving from lying on your back to sitting on the side of a flat bed without using bedrails?: A Little Help needed moving to and from a bed to a chair (including a wheelchair)?: A Little Help needed standing up from a chair using your arms (e.g., wheelchair or bedside chair)?: A Little Help needed to walk in hospital room?: A Little Help needed climbing 3-5 steps with a railing? : A Lot 6 Click Score: 17    End of Session Equipment Utilized During Treatment: Gait belt Activity Tolerance: Patient limited by fatigue;Patient tolerated treatment well Patient left: with family/visitor present;with call bell/phone within reach;in bed;with bed alarm set Nurse Communication: Mobility status PT Visit Diagnosis: Unsteadiness on feet (R26.81);Muscle weakness (generalized) (M62.81);Pain Pain - Right/Left: Left Pain - part of body: Knee;Leg     Time: 0981-1914 PT Time Calculation (min) (ACUTE ONLY): 26 min  Charges:                          Cecile Sheerer, SPT 01/10/23, 4:55 PM

## 2023-01-10 NOTE — Progress Notes (Signed)
Physical Therapy Treatment Patient Details Name: Dorothy Glenn MRN: [id number] DOB: [birth year] Today's Date: 01/10/2023   History of Present Illness Patient is a 89 y.o. female with medical history significant for Hearing impairment, GERD osteoarthritis s/p right knee replacement, HTN, PVD, partial R eye blindiness, Hep A, hypothyroidism and IBS with diarrhea who presents to the ED with left hip pain following an accidental fall. Current MD assessment includes: Displaced intertrochanteric fracture of left femur secondary to fall; now s/p IM nailing of Left femur.    PT Comments  Pt was pleasant and motivated to participate during the session and put forth good effort throughout. Pt found supine in bed, now on 2L O2. Initial readings of 96% SpO2 on 2L O2. Attempted trial of room air, nursing notified,upon exertion with bed mobility pt's SpO2 dropped to 84% with no SoB or dizziness, MD and nursing notified. Placed pt back on 2L and SpO2 values returned to 91 within ~1 min. Pt req min A to STS with RW from elevated EOB. Pt needed refresher for [facility] cues initially, but was able to maintain WB status for 15 feet of amb with RW around bed to recliner, pt needing CGA. Final session readings of 93% SpO2 on 2L. Pt will benefit from continued PT services upon discharge to safely address deficits listed in patient problem list for decreased caregiver assistance and eventual return to PLOF.      If plan is discharge home, recommend the following: A little help with walking and/or transfers;A little help with bathing/dressing/bathroom;Assist for transportation;Help with stairs or ramp for entrance;Assistance with cooking/housework   Can travel by private vehicle     Yes  Equipment Recommendations  Other (comment) (TBD at next venue of care)    Recommendations for Other Services       Precautions / Restrictions Precautions Precautions: Fall Precaution Booklet Issued: Yes (comment) Precaution Comments: HEP  packet Restrictions Weight Bearing Restrictions: Yes LLE Weight Bearing: Partial weight bearing LLE Partial Weight Bearing Percentage or Pounds: 50%     Mobility  Bed Mobility Overal bed mobility: Needs Assistance Bed Mobility: Supine to Sit     Supine to sit: HOB elevated, Used rails, Min assist     General bed mobility comments: Min A for LLE management    Transfers Overall transfer level: Needs assistance Equipment used: Rolling walker (2 wheels) Transfers: Sit to/from Stand Sit to Stand: Min assist, From elevated surface           General transfer comment: berbal and tactile cues for hand placement to stand, VC for RW mgt and WB precautions    Ambulation/Gait Ambulation/Gait assistance: Contact guard assist Gait Distance (Feet): 15 Feet Assistive device: Rolling walker (2 wheels) Gait Pattern/deviations: Step-to pattern, Decreased step length - right, Decreased step length - left, Decreased stance time - left Gait velocity: decreased     General Gait Details: Pt amb around bed to recliner, able to maintain PWB after initail cues   Stairs             Wheelchair Mobility     Tilt Bed    Modified Rankin (Stroke Patients Only)       Balance Overall balance assessment: Needs assistance Sitting-balance support: Single extremity supported, Feet supported Sitting balance-Leahy Scale: Fair     Standing balance support: Reliant on assistive device for balance, During functional activity, Bilateral upper extremity supported Standing balance-Leahy Scale: Poor Standing balance comment: Static standing  Cognition Arousal: Alert Behavior During Therapy: WFL for tasks assessed/performed Overall Cognitive Status: Within Functional Limits for tasks assessed                                 General Comments: Pt endorsing significant fatigue        Exercises Total Joint Exercises Ankle Circles/Pumps:  AROM, Strengthening, Both, 10 reps Long Arc Quad: Seated, Strengthening, AROM, Left, 10 reps    General Comments General comments (skin integrity, edema, etc.): Pt endorsing mild SOB and fatigue at start of session. SpO2 87-88% on room air. RN cleared OT to replace nasal cannula at 2L. With VC for PLB, SpO2 improves to 95-97%. Remained on 2L O2 for length of session and at end of session.      Pertinent Vitals/Pain Pain Assessment Pain Assessment: 0-10 Pain Score: 8  Pain Location: L LE Pain Descriptors / Indicators: Discomfort, Aching, Grimacing, Guarding, Moaning Pain Intervention(s): Monitored during session, Limited activity within patient's tolerance, Ice applied    Home Living                          Prior Function            PT Goals (current goals can now be found in the care plan section) Progress towards PT goals: Progressing toward goals    Frequency    BID      PT Plan      Co-evaluation              AM-PAC PT "6 Clicks" Mobility   Outcome Measure  Help needed turning from your back to your side while in a flat bed without using bedrails?: A Little Help needed moving from lying on your back to sitting on the side of a flat bed without using bedrails?: A Little Help needed moving to and from a bed to a chair (including a wheelchair)?: A Little Help needed standing up from a chair using your arms (e.g., wheelchair or bedside chair)?: A Little Help needed to walk in hospital room?: A Little Help needed climbing 3-5 steps with a railing? : A Lot 6 Click Score: 17    End of Session Equipment Utilized During Treatment: Gait belt Activity Tolerance: Patient limited by fatigue;Patient limited by pain Patient left: in chair;with family/visitor present;with chair alarm set;with call bell/phone within reach Nurse Communication: Mobility status PT Visit Diagnosis: Unsteadiness on feet (R26.81);Muscle weakness (generalized) (M62.81);Pain Pain -  Right/Left: Left Pain - part of body: Knee;Leg     Time: 1610-9604 PT Time Calculation (min) (ACUTE ONLY): 29 min  Charges:                            Cecile Sheerer, SPT 01/10/23, 1:14 PM

## 2023-01-10 NOTE — Progress Notes (Signed)
Subjective: 2 Days Post-Op Procedure(s) (LRB): INTRAMEDULLARY (IM) NAIL INTERTROCHANTERIC (Left) Patient is sitting up in bed, eating lunch, awake and oriented.  Pain is moderate.  The left knee still bothers her more than the hip.  Dressing has scant drainage.  Hemoglobin is stable at 9.1. I will obtain some Xylocaine and Kenalog to inject the left knee for arthritis relief. She may go to skilled nursing when a bed is available. 81 mg aspirin twice daily for DVT prophylaxis on discharge. Return in my clinic in 2 weeks for exam and x-ray.  Patient reports pain as mild.  Objective:   VITALS:   Vitals:   01/10/23 0743 01/10/23 0944  BP: (!) 170/74   Pulse: 85 71  Resp: 17   Temp: 97.9 F (36.6 C)   SpO2: 90% 94%    Neurologically intact Sensation intact distally Dorsiflexion/Plantar flexion intact Incision: scant drainage  LABS Recent Labs    01/08/23 0207 01/09/23 0327 01/10/23 0430  HGB 11.1* 9.7* 9.1*  HCT 35.0* 29.0* 26.8*  WBC 6.8 10.3 7.1  PLT 147* 144* 129*    Recent Labs    01/08/23 0207 01/09/23 0327 01/10/23 0430  NA 138 136 136  K 3.7 4.0 3.6  BUN 21 16 21   CREATININE 0.90 0.99 0.88  GLUCOSE 108* 127* 97    Recent Labs    01/08/23 0207  INR 1.0     Assessment/Plan: 2 Days Post-Op Procedure(s) (LRB): INTRAMEDULLARY (IM) NAIL INTERTROCHANTERIC (Left)   Advance diet Up with therapy Discharge to SNF when available

## 2023-01-10 NOTE — Progress Notes (Signed)
Occupational Therapy Treatment Patient Details Name: JONATHAN DANGEL MRN: [id number] DOB: 28-Aug-1928 Today's Date: 01/10/2023   History of present illness Patient is a 89 y.o. female with medical history significant for Hearing impairment, GERD osteoarthritis s/p right knee replacement, HTN, PVD, partial R eye blindiness, Hep A, hypothyroidism and IBS with diarrhea who presents to the ED with left hip pain following an accidental fall. Current MD assessment includes: Displaced intertrochanteric fracture of left femur secondary to fall; now s/p IM nailing of Left femur.   OT comments  Pt seen for OT tx this date. Pt endorsing more fatigue and pain this morning as well as mild SOB. SpO2 87-88% on room air. RN cleared to replace nasal cannula at 2L. Once donned and with VC for PLB, SpO2 improves to 96-97%. Kept on 2L throughout session. Pt required MOD A for bed mobility and STS transfers from EOB with VC for hand placement and VC for sequencing for step pivot to [facility]. MAX A for pericare in standing from nurse tech while pt required MIN A for static standing with OT. Pt returned to bed. Left on 2L O2 at end of session. Pt limited by pain this date. RN notified of pain and request for pain meds. Continues to benefit from skilled OT services.        If plan is discharge home, recommend the following:  A lot of help with walking and/or transfers;A lot of help with bathing/dressing/bathroom;Assistance with cooking/housework;Assist for transportation;Help with stairs or ramp for entrance   Equipment Recommendations  Other (comment);BSC/3in1    Recommendations for Other Services      Precautions / Restrictions Precautions Precautions: Fall Restrictions Weight Bearing Restrictions: Yes LLE Weight Bearing: Partial weight bearing LLE Partial Weight Bearing Percentage or Pounds: 50%       Mobility Bed Mobility Overal bed mobility: Needs Assistance Bed Mobility: Supine to Sit, Sit to Supine      Supine to sit: Mod assist, HOB elevated, Used rails Sit to supine: Mod assist, Max assist   General bed mobility comments: increased time/effort, pain limited    Transfers Overall transfer level: Needs assistance Equipment used: Rolling walker (2 wheels) Transfers: Sit to/from Stand, Bed to chair/wheelchair/BSC Sit to Stand: Mod assist, From elevated surface     Step pivot transfers: Mod assist, Min assist     General transfer comment: VC for hand placement to stand, VC for RW mgt and PWBing with SPT     Balance Overall balance assessment: Needs assistance Sitting-balance support: Single extremity supported, Feet supported Sitting balance-Leahy Scale: Fair     Standing balance support: Reliant on assistive device for balance, During functional activity, Bilateral upper extremity supported Standing balance-Leahy Scale: Poor Standing balance comment: requires MIN A to maintain static standing                           ADL either performed or assessed with clinical judgement   ADL Overall ADL's : Needs assistance/impaired                         Toilet Transfer: Moderate assistance;Rolling walker (2 wheels);BSC/3in1;Stand-pivot Toilet Transfer Details (indicate cue type and reason): heavy reliance on RW to maintain PWBing, MOD VC for sequencing, pain limited Toileting- Clothing Manipulation and Hygiene: Sit to/from stand;Maximal assistance;+2 for safety/equipment Toileting - Clothing Manipulation Details (indicate cue type and reason): MAX A from NT for pericare in standing, MIN A from OT  for static standing balance     Functional mobility during ADLs: Moderate assistance;Rolling walker (2 wheels);Cueing for sequencing      Extremity/Trunk Assessment              Vision       Perception     Praxis      Cognition Arousal: Alert Behavior During Therapy: WFL for tasks assessed/performed Overall Cognitive Status: Within Functional Limits  for tasks assessed                                 General Comments: Pt endorsing significant fatigue        Exercises Other Exercises Other Exercises: Pt educated in PWBing, PLB, sequencing for step pivot to maintain WBing precaution.    Shoulder Instructions       General Comments Pt endorsing mild SOB and fatigue at start of session. SpO2 87-88% on room air. RN cleared OT to replace nasal cannula at 2L. With VC for PLB, SpO2 improves to 95-97%. Remained on 2L O2 for length of session and at end of session.    Pertinent Vitals/ Pain       Pain Assessment Pain Assessment: 0-10 Pain Score: 7  Pain Location: L LE Pain Descriptors / Indicators: Discomfort, Aching, Grimacing, Guarding, Moaning Pain Intervention(s): Limited activity within patient's tolerance, Monitored during session, Repositioned, Patient requesting pain meds-RN notified, Ice applied  Home Living                                          Prior Functioning/Environment              Frequency  Min 1X/week        Progress Toward Goals  OT Goals(current goals can now be found in the care plan section)  Progress towards OT goals: Progressing toward goals  Acute Rehab OT Goals Patient Stated Goal: to get stronger OT Goal Formulation: With patient/family Time For Goal Achievement: 01/23/23 Potential to Achieve Goals: Fair  Plan      Co-evaluation                 AM-PAC OT "6 Clicks" Daily Activity     Outcome Measure   Help from another person eating meals?: None Help from another person taking care of personal grooming?: None Help from another person toileting, which includes using toliet, bedpan, or urinal?: A Lot Help from another person bathing (including washing, rinsing, drying)?: A Lot Help from another person to put on and taking off regular upper body clothing?: A Little Help from another person to put on and taking off regular lower body clothing?:  A Lot 6 Click Score: 17    End of Session Equipment Utilized During Treatment: Rolling walker (2 wheels);Oxygen  OT Visit Diagnosis: Unsteadiness on feet (R26.81);Repeated falls (R29.6);Muscle weakness (generalized) (M62.81);Pain Pain - Right/Left: Left Pain - part of body: Hip;Leg;Knee   Activity Tolerance Patient limited by pain   Patient Left in bed;with call bell/phone within reach;with bed alarm set;with family/visitor present   Nurse Communication Mobility status;Patient requests pain meds;Other (comment) (SpO2)        Time: 8119-1478 OT Time Calculation (min): 32 min  Charges: OT General Charges $OT Visit: 1 Visit OT Treatments $Self Care/Home Management : 23-37 mins  Arman Filter., MPH, MS, OTR/L ascom [phone] 01/10/23, 9:43  AM

## 2023-01-10 NOTE — Plan of Care (Signed)
  Problem: Education: Goal: Knowledge of General Education information will improve Description: Including pain rating scale, medication(s)/side effects and non-pharmacologic comfort measures Outcome: Progressing   Problem: Activity: Goal: Risk for activity intolerance will decrease Outcome: Progressing   Problem: Nutrition: Goal: Adequate nutrition will be maintained Outcome: Progressing   Problem: Elimination: Goal: Will not experience complications related to urinary retention Outcome: Progressing   Problem: Safety: Goal: Ability to remain free from injury will improve Outcome: Progressing

## 2023-01-10 NOTE — Plan of Care (Signed)
  Problem: Education: Goal: Knowledge of General Education information will improve Description: Including pain rating scale, medication(s)/side effects and non-pharmacologic comfort measures 01/10/2023 1747 by Frann Rider D, LPN Outcome: Progressing 01/10/2023 1647 by Frann Rider D, LPN Outcome: Progressing   Problem: Activity: Goal: Risk for activity intolerance will decrease 01/10/2023 1747 by Frann Rider D, LPN Outcome: Progressing 01/10/2023 1647 by Frann Rider D, LPN Outcome: Progressing   Problem: Nutrition: Goal: Adequate nutrition will be maintained 01/10/2023 1747 by Frann Rider D, LPN Outcome: Progressing 01/10/2023 1647 by Frann Rider D, LPN Outcome: Progressing   Problem: Elimination: Goal: Will not experience complications related to urinary retention Outcome: Progressing   Problem: Pain Managment: Goal: General experience of comfort will improve Outcome: Progressing   Problem: Safety: Goal: Ability to remain free from injury will improve 01/10/2023 1747 by Frann Rider D, LPN Outcome: Progressing 01/10/2023 1647 by Frann Rider D, LPN Outcome: Progressing

## 2023-01-10 NOTE — NC FL2 (Signed)
[place] MEDICAID FL2 LEVEL OF CARE FORM     IDENTIFICATION  Patient Name: Dorothy Glenn Birthdate: 27-Sep-1928 Sex: female Admission Date (Current Location): 01/08/2023  [facility] and [place] Number:  Chiropodist and Address:  [facility], [address] [place], [place] [address]      Provider Number: [id number]  Attending Physician Name and Address:  Loyce Dys, MD  Relative Name and Phone Number:       Current Level of Care: Hospital Recommended Level of Care: Skilled Nursing Facility Prior Approval Number:    Date Approved/Denied:   PASRR Number: [id number]  Discharge Plan: SNF    Current Diagnoses: Patient Active Problem List   Diagnosis Date Noted   Displaced intertrochanteric fracture of left femur (HCC) 01/08/2023   IBS (irritable bowel syndrome) 01/08/2023   Essential hypertension 01/08/2023   Gastro-esophageal reflux disease with esophagitis, without bleeding 07/15/2020   Bilateral primary osteoarthritis of knee 07/13/2020   Presence of right artificial knee joint 05/23/2020   Hypothyroidism, unspecified 05/23/2020   OA s/p right knee replacement (osteoarthritis) of knee 03/04/2013   Hypothyroidism 03/04/2013   GERD (gastroesophageal reflux disease) 03/04/2013   Diverticulitis of colon (without mention of hemorrhage)(562.11) 03/04/2013    Orientation RESPIRATION BLADDER Height & Weight     Self, Time, Situation, Place  O2 (Nasal Cannula 2 L) Continent Weight: 200 lb (90.7 kg) Height:  5\' 8"  (172.7 cm)  BEHAVIORAL SYMPTOMS/MOOD NEUROLOGICAL BOWEL NUTRITION STATUS   (None)  (None) Continent Diet (Regular)  AMBULATORY STATUS COMMUNICATION OF NEEDS Skin   Limited Assist Verbally Surgical wounds, Other (Comment) (Erythema/redness. Incision on left hip: Aquacel.)                       Personal Care Assistance Level of Assistance  Bathing, Feeding, Dressing Bathing Assistance: Maximum assistance Feeding  assistance: Limited assistance Dressing Assistance: Maximum assistance     Functional Limitations Info  Sight, Hearing, Speech Sight Info: Adequate Hearing Info: Adequate Speech Info: Adequate    SPECIAL CARE FACTORS FREQUENCY  PT (By licensed PT), OT (By licensed OT)     PT Frequency: 5 x week OT Frequency: 5 x week            Contractures Contractures Info: Not present    Additional Factors Info  Code Status, Allergies Code Status Info: Full code Allergies Info: Ciprofloxacin, Demerol (Meperidine), Evista (Raloxifene), Tamoxifen           Current Medications (01/10/2023):  This is the current hospital active medication list Current Facility-Administered Medications  Medication Dose Route Frequency Provider Last Rate Last Admin   0.45 % sodium chloride infusion   Intravenous Continuous Deeann Saint, MD   Stopped at 01/09/23 1746   acetaminophen (TYLENOL) tablet 325-650 mg  325-650 mg Oral Q6H PRN Deeann Saint, MD   650 mg at 01/09/23 1033   alum & mag hydroxide-simeth (MAALOX/MYLANTA) 200-200-20 MG/5ML suspension 30 mL  30 mL Oral Q4H PRN Deeann Saint, MD       bisacodyl (DULCOLAX) suppository 10 mg  10 mg Rectal Daily PRN Deeann Saint, MD       enoxaparin (LOVENOX) injection 30 mg  30 mg Subcutaneous Q24H Deeann Saint, MD   30 mg at 01/10/23 6073   ferrous sulfate tablet 325 mg  325 mg Oral Q breakfast Deeann Saint, MD   325 mg at 01/10/23 7106   HYDROcodone-acetaminophen (NORCO/VICODIN) 5-325 MG per tablet 1-2 tablet  1-2 tablet Oral  Q4H PRN Deeann Saint, MD   2 tablet at 01/10/23 5621   irbesartan (AVAPRO) tablet 150 mg  150 mg Oral Daily Loyce Dys, MD   150 mg at 01/10/23 3086   ketorolac (TORADOL) 15 MG/ML injection 15 mg  15 mg Intravenous Once Deeann Saint, MD       levothyroxine (SYNTHROID) tablet 50 mcg  50 mcg Oral QAC breakfast Deeann Saint, MD   50 mcg at 01/10/23 0518   magnesium hydroxide (MILK OF MAGNESIA) suspension 30 mL  30 mL Oral  Daily PRN Deeann Saint, MD       menthol-cetylpyridinium (CEPACOL) lozenge 3 mg  1 lozenge Oral PRN Deeann Saint, MD       Or   phenol (CHLORASEPTIC) mouth spray 1 spray  1 spray Mouth/Throat PRN Deeann Saint, MD       metoCLOPramide (REGLAN) tablet 5-10 mg  5-10 mg Oral Q8H PRN Deeann Saint, MD       Or   metoCLOPramide (REGLAN) injection 5-10 mg  5-10 mg Intravenous Q8H PRN Deeann Saint, MD       metoprolol succinate (TOPROL-XL) 24 hr tablet 100 mg  100 mg Oral QPM Deeann Saint, MD   100 mg at 01/09/23 1746   morphine (PF) 2 MG/ML injection 0.5-1 mg  0.5-1 mg Intravenous Q2H PRN Deeann Saint, MD       ondansetron [facility]) tablet 4 mg  4 mg Oral Q6H PRN Deeann Saint, MD       Or   ondansetron [facility]) injection 4 mg  4 mg Intravenous Q6H PRN Deeann Saint, MD       pantoprazole (PROTONIX) EC tablet 40 mg  40 mg Oral Daily Deeann Saint, MD   40 mg at 01/10/23 5784   senna (SENOKOT) tablet 8.6 mg  1 tablet Oral BID Deeann Saint, MD   8.6 mg at 01/10/23 6962   sodium phosphate (FLEET) enema 1 enema  1 enema Rectal Once PRN Deeann Saint, MD       zolpidem Remus Loffler) tablet 5 mg  5 mg Oral QHS PRN Deeann Saint, MD         Discharge Medications: Please see discharge summary for a list of discharge medications.  Relevant Imaging Results:  Relevant Lab Results:   Additional Information SS#: [id number]  Margarito Liner, LCSW

## 2023-01-10 NOTE — Plan of Care (Signed)
Patient A+Ox3-4. HOH. Eating and drinking well. Up to [facility] with 2A and walker. Pain with activity. Using pain medicine as needed. No s/s of infection.   Problem: Education: Goal: Knowledge of General Education information will improve Description: Including pain rating scale, medication(s)/side effects and non-pharmacologic comfort measures Outcome: Progressing   Problem: Health Behavior/Discharge Planning: Goal: Ability to manage health-related needs will improve Outcome: Progressing   Problem: Clinical Measurements: Goal: Will remain free from infection Outcome: Progressing   Problem: Activity: Goal: Risk for activity intolerance will decrease Outcome: Progressing   Problem: Nutrition: Goal: Adequate nutrition will be maintained Outcome: Progressing   Problem: Safety: Goal: Ability to remain free from injury will improve Outcome: Progressing   Problem: Skin Integrity: Goal: Risk for impaired skin integrity will decrease Outcome: Progressing

## 2023-01-10 NOTE — TOC Initial Note (Addendum)
Transition of Care [facility]) - Initial/Assessment Note    Patient Details  Name: Dorothy Glenn MRN: [id number] Date of Birth: [birth year]  Transition of Care [facility]) CM/SW Contact:    Margarito Liner, LCSW Phone Number: 01/10/2023, 11:24 AM  Clinical Narrative:   Patient asleep and did not awake to conversation. Son and daughter-in-law at bedside. CSW introduced role and explained that therapy recommendations would be discussed. Son and daughter-in-law are agreeable to SNF placement and prefer [facility]. They toured the facility and met with the admissions coordinator yesterday. CSW sent referral and left admissions coordinator a message to let her know it was in for her to review. No further concerns. CSW encouraged patient's family to contact CSW as needed. CSW will continue to follow patient for support and facilitate discharge once medically stable.      2:41 pm: [facility] offered a bed. CSW left message for admissions coordinator to see how soon they would be able to take her.           3:37 pm: Will plan for discharge to [facility]. Son is aware.  Expected Discharge Plan: Skilled Nursing Facility Barriers to Discharge: Continued Medical Work up   Patient Goals and CMS Choice            Expected Discharge Plan and Services     Post Acute Care Choice: Skilled Nursing Facility Living arrangements for the past 2 months: Single Family Home                                      Prior Living Arrangements/Services Living arrangements for the past 2 months: Single Family Home Lives with:: Adult Children Patient language and need for interpreter reviewed:: Yes        Need for Family Participation in Patient Care: Yes (Comment) Care giver support system in place?: Yes (comment)   Criminal Activity/Legal Involvement Pertinent to Current Situation/Hospitalization: No - Comment as needed  Activities of Daily Living Home Assistive Devices/Equipment:  Dentures (specify type) ADL Screening (condition at time of admission) Patient's cognitive ability adequate to safely complete daily activities?: Yes Is the patient deaf or have difficulty hearing?: Yes Does the patient have difficulty seeing, even when wearing glasses/contacts?: Yes Does the patient have difficulty concentrating, remembering, or making decisions?: Yes Patient able to express need for assistance with ADLs?: No Does the patient have difficulty dressing or bathing?: No Independently performs ADLs?: Yes (appropriate for developmental age) Does the patient have difficulty walking or climbing stairs?: Yes Weakness of Legs: Left Weakness of Arms/Hands: Both  Permission Sought/Granted Permission sought to share information with : Facility Medical sales representative, Family Supports    Share Information with NAME: Shadava Clarizio  Permission granted to share info w AGENCY: [facility]  Permission granted to share info w Relationship: Son  Permission granted to share info w Contact Information: [phone]  Emotional Assessment Appearance:: Appears stated age Attitude/Demeanor/Rapport: Unable to Assess Affect (typically observed): Unable to Assess Orientation: : Oriented to Self, Oriented to Place, Oriented to  Time, Oriented to Situation Alcohol / Substance Use: Not Applicable Psych Involvement: No (comment)  Admission diagnosis:  Displaced intertrochanteric fracture of left femur (HCC) [S72.142A] Closed fracture of femur, intertrochanteric, left, initial encounter [facility]) [X52.841L] Patient Active Problem List   Diagnosis Date Noted   Displaced intertrochanteric fracture of left femur (HCC) 01/08/2023   IBS (irritable bowel syndrome) 01/08/2023  Essential hypertension 01/08/2023   Gastro-esophageal reflux disease with esophagitis, without bleeding 07/15/2020   Bilateral primary osteoarthritis of knee 07/13/2020   Presence of right artificial knee joint 05/23/2020    Hypothyroidism, unspecified 05/23/2020   OA s/p right knee replacement (osteoarthritis) of knee 03/04/2013   Hypothyroidism 03/04/2013   GERD (gastroesophageal reflux disease) 03/04/2013   Diverticulitis of colon (without mention of hemorrhage)(562.11) 03/04/2013   PCP:  Richardean Chimera, MD Pharmacy:   [facility] [address], [place] - [address] [place] [address] Phone: [phone] Fax: [phone]     Social Determinants of Health (SDOH) Social History: SDOH Screenings   Food Insecurity: No Food Insecurity (01/08/2023)  Housing: Low Risk  (01/08/2023)  Transportation Needs: No Transportation Needs (01/08/2023)  Utilities: Not At Risk (01/08/2023)  Tobacco Use: Medium Risk (01/08/2023)   SDOH Interventions:     Readmission Risk Interventions     No data to display

## 2023-01-10 NOTE — Progress Notes (Signed)
Progress Note   Patient: Dorothy Glenn YNW:[id number] DOB: [birth year] DOA: 01/08/2023     2 DOS: the patient was seen and examined on 01/10/2023     Subjective: I have seen and examined patient at bedside this morning in the presence of the family Patient denies nausea vomiting chest pain or cough Her hip pain is better She is working with PT OT   Brief hospital course: From admission HPI "Dorothy Glenn is a 89 y.o. female with medical history significant for Hearing impairment, GERD osteoarthritis s/p right knee replacement, HTN, PVD, hypothyroidism and IBS with diarrhea who presents to the ED with left hip pain following an accidental fall.  Most of the history is given by her son and daughter-in-law at the bedside with whom she resides.  Patient was in her usual state of health prior to the fall.  Patient stated to the son that was watching a game on TV when she got up to flip the light switch on and as she turned to the left she fell onto her left hip.  She did not hit her head and did not lose consciousness.  She had immediate onset of hip pain.  She had no preceding palpitations, chest pain, shortness of breath, headache, visual disturbance, though history is limited due to impaired hearing. ED course and data review: BP 182/87 on arrival, improving to 163/78 with pain control.  Other vitals unremarkable. Labs including BMP and CBC notable for hemoglobin of 11.1, baseline unknown.  INR 1.0 EKG, personally viewed and interpreted shows sinus rhythm at 78 with no acute ST-T wave changes Chest x-ray showed mild cardiomegaly and pulmonary arterial hypertension X-ray hip showed mildly displaced intertrochanteric left femur fracture The ED provider spoke with orthopedist, Dr. Hyacinth Meeker who would like to take patient to the OR later in the a.m. Patient treated with morphine Hospitalist consulted for admission.  "   Assessment and Plan: * Displaced intertrochanteric fracture of left femur  (HCC) Mechanical fall History of unspecified complication of anesthesia Status post intramedullary nailing done on 01/08/2023 Plan of care discussed with orthopedic surgeon Continue current pain regiment Continue PT OT Transition of care manager consulted for possible placement needs Initial x-ray before surgery showed intertrochanteric fracture of the left femur Plan of care discussed with orthopedic surgeon Transition of care manager working on placement   Essential hypertension Continue metoprolol And hydralazine as needed   IBS (irritable bowel syndrome) Symptomatic treatment Continue omeprazole and probiotic   Hypothyroidism Continue levothyroxine   Anemia likely secondary to anemia of chronic disease as well as postoperative anemia Hemoglobin dropped from 11-9.7 We will continue to monitor closely No indication for blood transfusion at this time   OA s/p right knee replacement (osteoarthritis) of knee Continue current pain medications     DVT prophylaxis: Continue Lovenox   Consults: Orthopedist, Dr. Hyacinth Meeker   Advance Care Planning:   Code Status: Prior    Family Communication: Son and daughter-in-law at bedside   Disposition Plan: Rehab at [facility] comments   Data Reviewed: I have reviewed patient's lab results including CBC, BMP, orthopedics documentation, transition of care manager documentation nursing documentation as well as vitals        Latest Ref Rng & Units 01/10/2023    4:30 AM 01/09/2023    3:27 AM 01/08/2023    2:07 AM  CBC  WBC 4.0 - 10.5 K/uL 7.1  10.3  6.8   Hemoglobin 12.0 - 15.0 g/dL 9.1  9.7  65.7  Hematocrit 36.0 - 46.0 % 26.8  29.0  35.0   Platelets 150 - 400 K/uL 129  144  147        Latest Ref Rng & Units 01/10/2023    4:30 AM 01/09/2023    3:27 AM 01/08/2023    2:07 AM  BMP  Glucose 70 - 99 mg/dL 97  563  875   BUN 8 - 23 mg/dL 21  16  21    Creatinine 0.44 - 1.00 mg/dL 6.43  3.29  5.18   Sodium 135 - 145 mmol/L 136  136  138    Potassium 3.5 - 5.1 mmol/L 3.6  4.0  3.7   Chloride 98 - 111 mmol/L 105  104  105   CO2 22 - 32 mmol/L 23  26  23    Calcium 8.9 - 10.3 mg/dL 8.4  8.3  8.9      Vitals:   01/09/23 1746 01/10/23 0034 01/10/23 0743 01/10/23 0944  BP: (!) 155/85 135/61 (!) 170/74   Pulse: 93 79 85 71  Resp: 18 18 17    Temp: 97.9 F (36.6 C) 97.8 F (36.6 C) 97.9 F (36.6 C)   TempSrc:      SpO2: 93% 92% 90% 94%  Weight:      Height:        Author: Loyce Dys, MD 01/10/2023 3:46 PM  For on call review [email or web address].

## 2023-01-11 DIAGNOSIS — D62 Acute posthemorrhagic anemia: Secondary | ICD-10-CM | POA: Diagnosis not present

## 2023-01-11 DIAGNOSIS — K219 Gastro-esophageal reflux disease without esophagitis: Secondary | ICD-10-CM | POA: Diagnosis not present

## 2023-01-11 DIAGNOSIS — R638 Other symptoms and signs concerning food and fluid intake: Secondary | ICD-10-CM | POA: Diagnosis not present

## 2023-01-11 DIAGNOSIS — M1712 Unilateral primary osteoarthritis, left knee: Secondary | ICD-10-CM | POA: Diagnosis not present

## 2023-01-11 DIAGNOSIS — E039 Hypothyroidism, unspecified: Secondary | ICD-10-CM

## 2023-01-11 DIAGNOSIS — M1710 Unilateral primary osteoarthritis, unspecified knee: Secondary | ICD-10-CM | POA: Diagnosis not present

## 2023-01-11 DIAGNOSIS — S72142A Displaced intertrochanteric fracture of left femur, initial encounter for closed fracture: Secondary | ICD-10-CM | POA: Diagnosis not present

## 2023-01-11 DIAGNOSIS — Z7982 Long term (current) use of aspirin: Secondary | ICD-10-CM | POA: Diagnosis not present

## 2023-01-11 DIAGNOSIS — K589 Irritable bowel syndrome without diarrhea: Secondary | ICD-10-CM | POA: Diagnosis not present

## 2023-01-11 DIAGNOSIS — M810 Age-related osteoporosis without current pathological fracture: Secondary | ICD-10-CM | POA: Diagnosis not present

## 2023-01-11 DIAGNOSIS — L97509 Non-pressure chronic ulcer of other part of unspecified foot with unspecified severity: Secondary | ICD-10-CM | POA: Diagnosis not present

## 2023-01-11 DIAGNOSIS — I10 Essential (primary) hypertension: Secondary | ICD-10-CM | POA: Diagnosis not present

## 2023-01-11 DIAGNOSIS — Z96651 Presence of right artificial knee joint: Secondary | ICD-10-CM | POA: Diagnosis not present

## 2023-01-11 DIAGNOSIS — H9190 Unspecified hearing loss, unspecified ear: Secondary | ICD-10-CM | POA: Diagnosis not present

## 2023-01-11 DIAGNOSIS — M1990 Unspecified osteoarthritis, unspecified site: Secondary | ICD-10-CM | POA: Diagnosis not present

## 2023-01-11 DIAGNOSIS — R29898 Other symptoms and signs involving the musculoskeletal system: Secondary | ICD-10-CM | POA: Diagnosis not present

## 2023-01-11 DIAGNOSIS — K580 Irritable bowel syndrome with diarrhea: Secondary | ICD-10-CM | POA: Diagnosis not present

## 2023-01-11 DIAGNOSIS — W19XXXD Unspecified fall, subsequent encounter: Secondary | ICD-10-CM | POA: Diagnosis not present

## 2023-01-11 DIAGNOSIS — I82432 Acute embolism and thrombosis of left popliteal vein: Secondary | ICD-10-CM | POA: Diagnosis not present

## 2023-01-11 DIAGNOSIS — R601 Generalized edema: Secondary | ICD-10-CM | POA: Diagnosis not present

## 2023-01-11 DIAGNOSIS — Z7401 Bed confinement status: Secondary | ICD-10-CM | POA: Diagnosis not present

## 2023-01-11 DIAGNOSIS — Z853 Personal history of malignant neoplasm of breast: Secondary | ICD-10-CM | POA: Diagnosis not present

## 2023-01-11 DIAGNOSIS — R600 Localized edema: Secondary | ICD-10-CM | POA: Diagnosis not present

## 2023-01-11 DIAGNOSIS — I739 Peripheral vascular disease, unspecified: Secondary | ICD-10-CM | POA: Diagnosis not present

## 2023-01-11 DIAGNOSIS — I70211 Atherosclerosis of native arteries of extremities with intermittent claudication, right leg: Secondary | ICD-10-CM | POA: Diagnosis not present

## 2023-01-11 DIAGNOSIS — E559 Vitamin D deficiency, unspecified: Secondary | ICD-10-CM | POA: Diagnosis not present

## 2023-01-11 DIAGNOSIS — S72142D Displaced intertrochanteric fracture of left femur, subsequent encounter for closed fracture with routine healing: Secondary | ICD-10-CM | POA: Diagnosis not present

## 2023-01-11 DIAGNOSIS — H5461 Unqualified visual loss, right eye, normal vision left eye: Secondary | ICD-10-CM | POA: Diagnosis not present

## 2023-01-11 LAB — CBC WITH DIFFERENTIAL/PLATELET
Abs Immature Granulocytes: 0.04 10*3/uL (ref 0.00–0.07)
Basophils Absolute: 0 10*3/uL (ref 0.0–0.1)
Basophils Relative: 0 %
Eosinophils Absolute: 0 10*3/uL (ref 0.0–0.5)
Eosinophils Relative: 0 %
HCT: 27 % — ABNORMAL LOW (ref 36.0–46.0)
Hemoglobin: 9 g/dL — ABNORMAL LOW (ref 12.0–15.0)
Immature Granulocytes: 1 %
Lymphocytes Relative: 9 %
Lymphs Abs: 0.7 10*3/uL (ref 0.7–4.0)
MCH: 29.9 pg (ref 26.0–34.0)
MCHC: 33.3 g/dL (ref 30.0–36.0)
MCV: 89.7 fL (ref 80.0–100.0)
Monocytes Absolute: 0.5 10*3/uL (ref 0.1–1.0)
Monocytes Relative: 6 %
Neutro Abs: 6.2 10*3/uL (ref 1.7–7.7)
Neutrophils Relative %: 84 %
Platelets: 144 10*3/uL — ABNORMAL LOW (ref 150–400)
RBC: 3.01 MIL/uL — ABNORMAL LOW (ref 3.87–5.11)
RDW: 14.1 % (ref 11.5–15.5)
WBC: 7.4 10*3/uL (ref 4.0–10.5)
nRBC: 0 % (ref 0.0–0.2)

## 2023-01-11 LAB — BASIC METABOLIC PANEL
Anion gap: 3 — ABNORMAL LOW (ref 5–15)
BUN: 17 mg/dL (ref 8–23)
CO2: 25 mmol/L (ref 22–32)
Calcium: 8.3 mg/dL — ABNORMAL LOW (ref 8.9–10.3)
Chloride: 109 mmol/L (ref 98–111)
Creatinine, Ser: 0.73 mg/dL (ref 0.44–1.00)
GFR, Estimated: >60 mL/min (ref >60)
Glucose, Bld: 122 mg/dL — ABNORMAL HIGH (ref 70–99)
Potassium: 4.1 mmol/L (ref 3.5–5.1)
Sodium: 137 mmol/L (ref 135–145)

## 2023-01-11 MED ORDER — HYDROCODONE-ACETAMINOPHEN 5-325 MG PO TABS: 1.0000 | ORAL_TABLET | Freq: Four times a day (QID) | ORAL | 0 refills | Status: AC | PRN

## 2023-01-11 MED ORDER — SENNA 8.6 MG PO TABS: 2.0000 | ORAL_TABLET | Freq: Every evening | ORAL | Status: AC | PRN

## 2023-01-11 MED ORDER — BISACODYL 10 MG RE SUPP: 10.0000 mg | Freq: Every day | RECTAL | Status: AC | PRN

## 2023-01-11 MED ORDER — ASPIRIN 81 MG PO TBEC: 81.0000 mg | DELAYED_RELEASE_TABLET | Freq: Two times a day (BID) | ORAL | Status: AC

## 2023-01-11 NOTE — Progress Notes (Signed)
Physical Therapy Treatment Patient Details Name: Dorothy Glenn MRN: [id number] DOB: 1928/08/07 Today's Date: 01/11/2023   History of Present Illness Patient is a 89 y.o. female with medical history significant for Hearing impairment, GERD osteoarthritis s/p right knee replacement, HTN, PVD, partial R eye blindiness, Hep A, hypothyroidism and IBS with diarrhea who presents to the ED with left hip pain following an accidental fall. Current MD assessment includes: Displaced intertrochanteric fracture of left femur secondary to fall; now s/p IM nailing of Left femur.    PT Comments  Pt was pleasant and motivated to participate during the session and put forth good effort throughout. Pt improving with bed mobility and transfers compared to prior sessions, now able to adjust seated position and scoot to EOB without physical assist, and is now CGA for STS transfers from elevated EOB or recliner. Pt amb 10 feet x 2 with RW at [facility] level with seated rest break between, did not attempt additional bout due to pt having of 9/10 pain and fatigue, nursing notified for pt's pain level. Pt still requiring consistent VC's to maintain PWB during amb. Pt will benefit from continued PT services upon discharge to safely address deficits listed in patient problem list for decreased caregiver assistance and eventual return to PLOF.    If plan is discharge home, recommend the following: A little help with walking and/or transfers;A little help with bathing/dressing/bathroom;Assist for transportation;Help with stairs or ramp for entrance;Assistance with cooking/housework   Can travel by private vehicle     Yes  Equipment Recommendations  Other (comment) (TBD at next venue)    Recommendations for Other Services       Precautions / Restrictions Precautions Precautions: Fall Restrictions Weight Bearing Restrictions: Yes LLE Weight Bearing: Partial weight bearing LLE Partial Weight Bearing Percentage or Pounds: 50%      Mobility  Bed Mobility Overal bed mobility: Needs Assistance Bed Mobility: Supine to Sit     Supine to sit: HOB elevated, Used rails, Min assist, Contact guard     General bed mobility comments: cues provided for safety and sequencing, Min A initally progresseed to CGA able to scoot forward and adjust sitting position with VC's    Transfers Overall transfer level: Needs assistance Equipment used: Rolling walker (2 wheels) Transfers: Sit to/from Stand Sit to Stand: Contact guard assist, From elevated surface           General transfer comment: Pt now CGA with STS from EOB, continues to req no physical assit when performing STS from recliner.    Ambulation/Gait Ambulation/Gait assistance: Contact guard assist Gait Distance (Feet): 10 Feet x2 Assistive device: Rolling walker (2 wheels) Gait Pattern/deviations: Step-to pattern, Decreased step length - right, Decreased step length - left, Decreased stance time - left, Antalgic Gait velocity: decreased     General Gait Details: Amb from recliner to wall with chair follow, sititng down once at wall and restarting from opposite wall x2, limited by LLE pain. Continues to req cues for PWB maintenance when amb.   Stairs             Wheelchair Mobility     Tilt Bed    Modified Rankin (Stroke Patients Only)       Balance Overall balance assessment: Needs assistance Sitting-balance support: Feet supported, Single extremity supported Sitting balance-Leahy Scale: Fair     Standing balance support: Reliant on assistive device for balance, During functional activity, Bilateral upper extremity supported Standing balance-Leahy Scale: Fair Standing balance comment: Static standing  Cognition Arousal: Alert Behavior During Therapy: WFL for tasks assessed/performed Overall Cognitive Status: Within Functional Limits for tasks assessed                                           Exercises      General Comments        Pertinent Vitals/Pain Pain Assessment Pain Assessment: 0-10 Pain Score: 9  Pain Location: L LE Pain Descriptors / Indicators: Discomfort, Aching, Grimacing Pain Intervention(s): Monitored during session, Limited activity within patient's tolerance    Home Living                          Prior Function            PT Goals (current goals can now be found in the care plan section) Progress towards PT goals: Progressing toward goals    Frequency    BID      PT Plan      Co-evaluation              AM-PAC PT "6 Clicks" Mobility   Outcome Measure  Help needed turning from your back to your side while in a flat bed without using bedrails?: A Little Help needed moving from lying on your back to sitting on the side of a flat bed without using bedrails?: A Little Help needed moving to and from a bed to a chair (including a wheelchair)?: A Little Help needed standing up from a chair using your arms (e.g., wheelchair or bedside chair)?: A Little Help needed to walk in hospital room?: A Little Help needed climbing 3-5 steps with a railing? : A Lot 6 Click Score: 17    End of Session Equipment Utilized During Treatment: Gait belt Activity Tolerance: Patient tolerated treatment well;Patient limited by pain Patient left: with family/visitor present;in chair;with chair alarm set;with nursing/sitter in room Nurse Communication: Mobility status PT Visit Diagnosis: Unsteadiness on feet (R26.81);Muscle weakness (generalized) (M62.81);Pain Pain - Right/Left: Left Pain - part of body: Knee;Leg     Time: 1610-9604 PT Time Calculation (min) (ACUTE ONLY): 18 min  Charges:                            Cecile Sheerer, SPT 01/11/23, 1:18 PM

## 2023-01-11 NOTE — Plan of Care (Signed)
  Problem: Education: Goal: Knowledge of General Education information will improve Description: Including pain rating scale, medication(s)/side effects and non-pharmacologic comfort measures Outcome: Progressing   Problem: Health Behavior/Discharge Planning: Goal: Ability to manage health-related needs will improve Outcome: Progressing   Problem: Clinical Measurements: Goal: Will remain free from infection Outcome: Progressing Goal: Diagnostic test results will improve Outcome: Progressing   Problem: Activity: Goal: Risk for activity intolerance will decrease Outcome: Progressing   Problem: Nutrition: Goal: Adequate nutrition will be maintained Outcome: Progressing   Problem: Elimination: Goal: Will not experience complications related to bowel motility Outcome: Progressing Goal: Will not experience complications related to urinary retention Outcome: Progressing   Problem: Safety: Goal: Ability to remain free from injury will improve Outcome: Progressing

## 2023-01-11 NOTE — Care Management Important Message (Signed)
Important Message  Patient Details  Name: Dorothy Glenn MRN: [id number] Date of Birth: [birth year]   Medicare Important Message Given:  N/A - LOS <3 / Initial given by admissions     Olegario Messier A Dafna Romo 01/11/2023, 10:22 AM

## 2023-01-11 NOTE — Discharge Summary (Signed)
Physician Discharge Summary   Patient: Dorothy Glenn MRN: [id number]  DOB: Jan 22, 1929   Admit:     Date of Admission: 01/08/2023 Admitted from: home   Discharge: Date of discharge: 01/11/23 Disposition: Skilled nursing facility Condition at discharge: good  CODE STATUS: FULL CODE     Discharge Physician: Sunnie Nielsen, DO [facility]     PCP: Richardean Chimera, MD  Recommendations for Outpatient Follow-up:  Follow up with PCP Richardean Chimera, MD in 2-4 weeks Follow up w/ Dr Hyacinth Meeker Ortho in 2 weeks DVT Ppx ASA 81 mg bid         Discharge Diagnoses: Principal Problem:   Displaced intertrochanteric fracture of left femur (HCC) Active Problems:   OA s/p right knee replacement (osteoarthritis) of knee   Hypothyroidism   IBS (irritable bowel syndrome)   Essential hypertension       Hospital Course: Dorothy Glenn is a 89 y.o. female with medical history significant for Hearing impairment, GERD osteoarthritis s/p right knee replacement, HTN, PVD, hypothyroidism and IBS with diarrhea who presents to the ED with left hip pain following an accidental fall. Patient stated to the son that was watching a game on TV when she got up to flip the light switch on and as she turned to the left she fell onto her left hip. She did not hit her head and did not lose consciousness.  08/18: to ED. Admitted w/ mildly displaced intertrochanteric left femur fracture, underwent intramedullary nail to L femur w/ Dr Hyacinth Meeker  08/19-08/20: PT/OT recs for SNF rehab 08/21: ok for discharge to [facility]     Consultants:  Orthopedics   Procedures: 01/08/23: intramedullary nail to L femur w/ Dr Hyacinth Meeker       ASSESSMENT & PLAN:   Displaced intertrochanteric fracture of left femur Mechanical fall History of unspecified complication of anesthesia Status post intramedullary nailing done on 01/08/2023 Pain control PT OT at rehab ASA 81 mg bid DVT Ppx  Follow w/ ortho in 2  weeks  TOC following for rehab placement    Essential hypertension Continue metoprolol   IBS (irritable bowel syndrome) Symptomatic treatment Continue omeprazole and probiotic   Hypothyroidism continue levothyroxine   Anemia likely secondary to anemia of chronic disease as well as postoperative anemia Hemoglobin dropped from 11-9.7 We will continue to monitor CBC closely No indication for blood transfusion at this time   OA s/p right knee replacement (osteoarthritis) of knee Continue current pain medications         Discharge Instructions  Allergies as of 01/11/2023       Reactions   Ciprofloxacin Nausea And Vomiting   Demerol [meperidine] Nausea Only   Evista [raloxifene] Nausea And Vomiting   Tamoxifen Nausea And Vomiting        Medication List     STOP taking these medications    Align 4 MG Caps       TAKE these medications    acetaminophen 500 MG tablet Commonly known as: TYLENOL Take 500 mg by mouth at bedtime.   aspirin EC 81 MG tablet Take 1 tablet (81 mg total) by mouth in the morning and at bedtime. Swallow whole.   bisacodyl 10 MG suppository Commonly known as: DULCOLAX Place 1 suppository (10 mg total) rectally daily as needed for moderate constipation.   HYDROcodone-acetaminophen 5-325 MG tablet Commonly known as: NORCO/VICODIN Take 1 tablet by mouth every 6 (six) hours as needed for moderate pain or severe pain.  levothyroxine 50 MCG tablet Commonly known as: SYNTHROID Take 50 mcg by mouth daily before breakfast.   metoprolol succinate 100 MG 24 hr tablet Commonly known as: TOPROL-XL Take 100 mg by mouth every evening. Take with or immediately following a meal.   olmesartan 20 MG tablet Commonly known as: BENICAR Take 20 mg by mouth daily.   omeprazole 20 MG capsule Commonly known as: PRILOSEC Take 20 mg by mouth daily.   senna 8.6 MG Tabs tablet Commonly known as: SENOKOT Take 2 tablets (17.2 mg total) by mouth at  bedtime as needed for mild constipation or moderate constipation.   Vitamin D (Ergocalciferol) 50 MCG (2000 UT) Caps Take 2,000 Units by mouth daily.         Contact information for after-discharge care     Destination     [facility] Preferred SNF .   Service: Skilled Nursing Contact information: [address] [place] [address] [phone]                     Allergies  Allergen Reactions   Ciprofloxacin Nausea And Vomiting   Demerol [Meperidine] Nausea Only   Evista [Raloxifene] Nausea And Vomiting   Tamoxifen Nausea And Vomiting     Subjective: pt reports knee is sore, finally had BM< otherwise no complaints today    Discharge Exam: BP (!) 148/68 (BP Location: Right Arm)   Pulse 72   Temp 98.3 F (36.8 C)   Resp 16   Ht 5\' 8"  (1.727 m)   Wt 90.7 kg   SpO2 94%   BMI 30.41 kg/m  General: Pt is alert, awake, not in acute distress Cardiovascular: RRR, S1/S2 +, no rubs, no gallops Respiratory: CTA bilaterally, no wheezing, no rhonchi Abdominal: Soft, NT, ND, bowel sounds + Extremities: no edema, no cyanosis     The results of significant diagnostics from this hospitalization (including imaging, microbiology, ancillary and laboratory) are listed below for reference.     Microbiology: No results found for this or any previous visit (from the past 240 hour(s)).   Labs: BNP (last 3 results) No results for input(s): "BNP" in the last 8760 hours. Basic Metabolic Panel: Recent Labs  Lab 01/08/23 0207 01/09/23 0327 01/10/23 0430 01/11/23 0318  NA 138 136 136 137  K 3.7 4.0 3.6 4.1  CL 105 104 105 109  CO2 23 26 23 25   GLUCOSE 108* 127* 97 122*  BUN 21 16 21 17   CREATININE 0.90 0.99 0.88 0.73  CALCIUM 8.9 8.3* 8.4* 8.3*   Liver Function Tests: No results for input(s): "AST", "ALT", "ALKPHOS", "BILITOT", "PROT", "ALBUMIN" in the last 168  hours. No results for input(s): "LIPASE", "AMYLASE" in the last 168 hours. No results for input(s): "AMMONIA" in the last 168 hours. CBC: Recent Labs  Lab 01/08/23 0207 01/09/23 0327 01/10/23 0430 01/11/23 0318  WBC 6.8 10.3 7.1 7.4  NEUTROABS 4.1 8.8* 5.1 6.2  HGB 11.1* 9.7* 9.1* 9.0*  HCT 35.0* 29.0* 26.8* 27.0*  MCV 93.3 89.0 88.7 89.7  PLT 147* 144* 129* 144*   Cardiac Enzymes: No results for input(s): "CKTOTAL", "CKMB", "CKMBINDEX", "TROPONINI" in the last 168 hours. BNP: Invalid input(s): "POCBNP" CBG: No results for input(s): "GLUCAP" in the last 168 hours. D-Dimer No results for input(s): "DDIMER" in the last 72 hours. Hgb A1c No results for input(s): "HGBA1C" in the last 72 hours. Lipid Profile No results for input(s): "CHOL", "HDL", "  LDLCALC", "TRIG", "CHOLHDL", "LDLDIRECT" in the last 72 hours. Thyroid function studies No results for input(s): "TSH", "T4TOTAL", "T3FREE", "THYROIDAB" in the last 72 hours.  Invalid input(s): "FREET3" Anemia work up No results for input(s): "VITAMINB12", "FOLATE", "FERRITIN", "TIBC", "IRON", "RETICCTPCT" in the last 72 hours. Urinalysis    Component Value Date/Time   COLORURINE YELLOW 02/21/2013 1429   APPEARANCEUR CLEAR 02/21/2013 1429   LABSPEC 1.016 02/21/2013 1429   PHURINE 6.0 02/21/2013 1429   GLUCOSEU NEGATIVE 02/21/2013 1429   HGBUR NEGATIVE 02/21/2013 1429   BILIRUBINUR NEGATIVE 02/21/2013 1429   KETONESUR NEGATIVE 02/21/2013 1429   PROTEINUR NEGATIVE 02/21/2013 1429   UROBILINOGEN 0.2 02/21/2013 1429   NITRITE NEGATIVE 02/21/2013 1429   LEUKOCYTESUR SMALL (A) 02/21/2013 1429   Sepsis Labs Recent Labs  Lab 01/08/23 0207 01/09/23 0327 01/10/23 0430 01/11/23 0318  WBC 6.8 10.3 7.1 7.4   Microbiology No results found for this or any previous visit (from the past 240 hour(s)). Imaging DG Knee Complete 4 Views Left  Result Date: 01/09/2023 CLINICAL DATA:  Left knee pain.  Known hip fracture. EXAM: LEFT KNEE  - COMPLETE 4+ VIEW COMPARISON:  None Available. FINDINGS: Moderate to severe degenerative changes are present in the medial compartment of the left knee. No acute or healing fractures are present. More mild degenerative changes are present in the lateral and patellofemoral components. No joint effusion is present. The soft tissues are otherwise unremarkable. IMPRESSION: 1. Moderate to severe degenerative changes in the medial compartment of the left knee. 2. No acute or healing fractures. Electronically Signed   By: Marin Roberts M.D.   On: 01/09/2023 14:47   DG HIP UNILAT WITH PELVIS 2-3 VIEWS LEFT  Result Date: 01/08/2023 CLINICAL DATA:  Elective surgery. EXAM: DG HIP (WITH OR WITHOUT PELVIS) 3V LEFT COMPARISON:  Hip radiograph from earlier today FINDINGS: Three intraprocedural fluoroscopic images show femoral nail and dynamic hip screw placement for a proximal left femur fracture. No unexpected finding IMPRESSION: Fluoroscopy for femur fracture fixation.  No unexpected finding Electronically Signed   By: Tiburcio Pea M.D.   On: 01/08/2023 12:58   DG C-Arm 1-60 Min-No Report  Result Date: 01/08/2023 Fluoroscopy was utilized by the requesting physician.  No radiographic interpretation.   DG C-Arm 1-60 Min-No Report  Result Date: 01/08/2023 Fluoroscopy was utilized by the requesting physician.  No radiographic interpretation.   DG Chest Port 1 View  Result Date: 01/08/2023 CLINICAL DATA:  Fall, left hip fracture, medical clearance for surgical intervention. EXAM: PORTABLE CHEST 1 VIEW COMPARISON:  None Available. FINDINGS: Lungs are clear. No pneumothorax or pleural effusion. Cardiac size is mildly enlarged. Prominence of the right hilum relates to enlargement of the central pulmonary arteries better seen on CT examination of 11/19/2021. No superimposed overt pulmonary edema. No acute bone abnormality. IMPRESSION: 1. Mild cardiomegaly. 2. Pulmonary arterial hypertension. Electronically  Signed   By: Helyn Numbers M.D.   On: 01/08/2023 03:22   DG HIP UNILAT WITH PELVIS 2-3 VIEWS LEFT  Result Date: 01/08/2023 CLINICAL DATA:  Left hip pain due to fall EXAM: DG HIP (WITH OR WITHOUT PELVIS) 2-3V LEFT COMPARISON:  None Available. FINDINGS: Mildly displaced intertrochanteric fracture of the left femur. No dislocation. Degenerative changes pubic symphysis, both hips, SI joints and lower lumbar spine. IMPRESSION: Mildly displaced intertrochanteric left femur fracture. Electronically Signed   By: Minerva Fester M.D.   On: 01/08/2023 03:21      Time coordinating discharge: over 30 minutes  SIGNED:  Sunnie Nielsen DO  [facility]

## 2023-01-11 NOTE — Progress Notes (Signed)
Subjective: 3 Days Post-Op Procedure(s) (LRB): INTRAMEDULLARY (IM) NAIL INTERTROCHANTERIC (Left) Patient is doing well and is out of bed in the chair.  Minimal discomfort.  Cortisone shot left knee seems to be helping some.  She is going to skilled nursing rehab today.  She should follow-up in my office in 2 weeks.  81 mg ASA twice daily for 6 weeks for DVT prophylaxis.  Patient reports pain as mild.  Objective:   VITALS:   Vitals:   01/10/23 2300 01/11/23 0900  BP: (!) 145/82 (!) 148/68  Pulse: 79 72  Resp: 18 16  Temp: 98.1 F (36.7 C) 98.3 F (36.8 C)  SpO2: 94% 94%    Neurologically intact Sensation intact distally Dorsiflexion/Plantar flexion intact Incision: scant drainage  LABS Recent Labs    01/09/23 0327 01/10/23 0430 01/11/23 0318  HGB 9.7* 9.1* 9.0*  HCT 29.0* 26.8* 27.0*  WBC 10.3 7.1 7.4  PLT 144* 129* 144*    Recent Labs    01/09/23 0327 01/10/23 0430 01/11/23 0318  NA 136 136 137  K 4.0 3.6 4.1  BUN 16 21 17   CREATININE 0.99 0.88 0.73  GLUCOSE 127* 97 122*    No results for input(s): "LABPT", "INR" in the last 72 hours.   Assessment/Plan: 3 Days Post-Op Procedure(s) (LRB): INTRAMEDULLARY (IM) NAIL INTERTROCHANTERIC (Left)   Discharge to SNF Return to clinic 2 weeks for exam and x-ray 50% weightbearing left leg 81 mg ASA twice daily for 6 weeks

## 2023-01-11 NOTE — Hospital Course (Signed)
Dorothy Glenn is a 89 y.o. female with medical history significant for Hearing impairment, GERD osteoarthritis s/p right knee replacement, HTN, PVD, hypothyroidism and IBS with diarrhea who presents to the ED with left hip pain following an accidental fall. Patient stated to the son that was watching a game on TV when she got up to flip the light switch on and as she turned to the left she fell onto her left hip. She did not hit her head and did not lose consciousness.  08/18: to ED. Admitted w/ mildly displaced intertrochanteric left femur fracture, underwent intramedullary nail to L femur w/ Dr Hyacinth Meeker  08/19:    Consultants:  Orthopedics   Procedures: 01/08/23: intramedullary nail to L femur w/ Dr Hyacinth Meeker       ASSESSMENT & PLAN:   Principal Problem:   Displaced intertrochanteric fracture of left femur (HCC) Active Problems:   OA s/p right knee replacement (osteoarthritis) of knee   Hypothyroidism   IBS (irritable bowel syndrome)   Essential hypertension  Displaced intertrochanteric fracture of left femur Mechanical fall History of unspecified complication of anesthesia Status post intramedullary nailing done on 01/08/2023 Pain control PT OT TOC following for rehab placement    Essential hypertension Continue metoprolol hydralazine as needed   IBS (irritable bowel syndrome) Symptomatic treatment Continue omeprazole and probiotic   Hypothyroidism continue levothyroxine   Anemia likely secondary to anemia of chronic disease as well as postoperative anemia Hemoglobin dropped from 11-9.7 We will continue to monitor CBC closely No indication for blood transfusion at this time   OA s/p right knee replacement (osteoarthritis) of knee Continue current pain medications       DVT prophylaxis: *** Pertinent IV fluids/nutrition: *** Central lines / invasive devices: ***  Code Status: *** ACP documentation reviewed: ***  Current Admission Status: ***  TOC needs /  Dispo plan: *** Barriers to discharge / significant pending items: ***

## 2023-01-11 NOTE — TOC Transition Note (Signed)
Transition of Care [facility]) - CM/SW Discharge Note   Patient Details  Name: Dorothy Glenn MRN: [id number] Date of Birth: 1929/03/05  Transition of Care [facility]) CM/SW Contact:  Garret Reddish, RN Phone Number: 01/11/2023, 12:13 PM   Clinical Narrative:   Chart reviewed.  Noted that patient has orders for discharge today.  I have spoken with Tiffany with [facility]. Tiffany informs me that patient will have a bed at [facility] today.  Tiffany reports that patient will go to room 607 and the number to call report is [phone].  I have sent Tiffany patient's discharge summary, SNF report, and Discharge orders via the hub.    I have informed patient's son Sandie Harke that patient would be a discharge for today.  I have informed Loraine Leriche that [facility] will transport patient to the facility today.    I have arranged [facility] for transport to the facility today.   I have informed staff nurse of the above information.      Final next level of care: Skilled Nursing Facility Barriers to Discharge: No Barriers Identified   Patient Goals and CMS Choice CMS Medicare.gov Compare Post Acute Care list provided to:: Patient Represenative (must comment) (Patient's son Forrestine Redeker) Choice offered to / list presented to : Adult Children  Discharge Placement                Patient chooses bed at: [facility] Patient to be transferred to facility by: [facility] Name of family member notified: Chelcee Gulbrandson Patient and family notified of of transfer: 01/11/23  Discharge Plan and Services Additional resources added to the After Visit Summary for       Post Acute Care Choice: Skilled Nursing Facility                               Social Determinants of Health (SDOH) Interventions SDOH Screenings   Food Insecurity: No Food Insecurity (01/08/2023)  Housing: Low Risk  (01/08/2023)  Transportation Needs: No Transportation Needs (01/08/2023)   Utilities: Not At Risk (01/08/2023)  Tobacco Use: Medium Risk (01/08/2023)     Readmission Risk Interventions     No data to display

## 2023-01-11 NOTE — Progress Notes (Signed)
Called report  to [employer] at [facility]

## 2023-01-13 DIAGNOSIS — I10 Essential (primary) hypertension: Secondary | ICD-10-CM | POA: Diagnosis not present

## 2023-01-13 DIAGNOSIS — M1990 Unspecified osteoarthritis, unspecified site: Secondary | ICD-10-CM | POA: Diagnosis not present

## 2023-01-13 DIAGNOSIS — D62 Acute posthemorrhagic anemia: Secondary | ICD-10-CM | POA: Diagnosis not present

## 2023-01-13 DIAGNOSIS — W19XXXD Unspecified fall, subsequent encounter: Secondary | ICD-10-CM | POA: Diagnosis not present

## 2023-01-13 DIAGNOSIS — K219 Gastro-esophageal reflux disease without esophagitis: Secondary | ICD-10-CM | POA: Diagnosis not present

## 2023-01-13 DIAGNOSIS — S72142D Displaced intertrochanteric fracture of left femur, subsequent encounter for closed fracture with routine healing: Secondary | ICD-10-CM | POA: Diagnosis not present

## 2023-01-13 DIAGNOSIS — E039 Hypothyroidism, unspecified: Secondary | ICD-10-CM | POA: Diagnosis not present

## 2023-01-13 DIAGNOSIS — K589 Irritable bowel syndrome without diarrhea: Secondary | ICD-10-CM | POA: Diagnosis not present

## 2023-01-16 DIAGNOSIS — I10 Essential (primary) hypertension: Secondary | ICD-10-CM | POA: Diagnosis not present

## 2023-01-16 DIAGNOSIS — K589 Irritable bowel syndrome without diarrhea: Secondary | ICD-10-CM | POA: Diagnosis not present

## 2023-01-16 DIAGNOSIS — M1990 Unspecified osteoarthritis, unspecified site: Secondary | ICD-10-CM | POA: Diagnosis not present

## 2023-01-16 DIAGNOSIS — S72142D Displaced intertrochanteric fracture of left femur, subsequent encounter for closed fracture with routine healing: Secondary | ICD-10-CM | POA: Diagnosis not present

## 2023-01-16 DIAGNOSIS — K219 Gastro-esophageal reflux disease without esophagitis: Secondary | ICD-10-CM | POA: Diagnosis not present

## 2023-01-17 DIAGNOSIS — S72142D Displaced intertrochanteric fracture of left femur, subsequent encounter for closed fracture with routine healing: Secondary | ICD-10-CM | POA: Diagnosis not present

## 2023-01-17 DIAGNOSIS — E039 Hypothyroidism, unspecified: Secondary | ICD-10-CM | POA: Diagnosis not present

## 2023-01-17 DIAGNOSIS — K589 Irritable bowel syndrome without diarrhea: Secondary | ICD-10-CM | POA: Diagnosis not present

## 2023-01-17 DIAGNOSIS — R600 Localized edema: Secondary | ICD-10-CM | POA: Diagnosis not present

## 2023-01-17 DIAGNOSIS — M1990 Unspecified osteoarthritis, unspecified site: Secondary | ICD-10-CM | POA: Diagnosis not present

## 2023-01-17 DIAGNOSIS — K219 Gastro-esophageal reflux disease without esophagitis: Secondary | ICD-10-CM | POA: Diagnosis not present

## 2023-01-17 DIAGNOSIS — I10 Essential (primary) hypertension: Secondary | ICD-10-CM | POA: Diagnosis not present

## 2023-01-17 DIAGNOSIS — D62 Acute posthemorrhagic anemia: Secondary | ICD-10-CM | POA: Diagnosis not present

## 2023-01-18 DIAGNOSIS — E039 Hypothyroidism, unspecified: Secondary | ICD-10-CM | POA: Diagnosis not present

## 2023-01-18 DIAGNOSIS — S72142D Displaced intertrochanteric fracture of left femur, subsequent encounter for closed fracture with routine healing: Secondary | ICD-10-CM | POA: Diagnosis not present

## 2023-01-18 DIAGNOSIS — K219 Gastro-esophageal reflux disease without esophagitis: Secondary | ICD-10-CM | POA: Diagnosis not present

## 2023-01-18 DIAGNOSIS — D62 Acute posthemorrhagic anemia: Secondary | ICD-10-CM | POA: Diagnosis not present

## 2023-01-18 DIAGNOSIS — I10 Essential (primary) hypertension: Secondary | ICD-10-CM | POA: Diagnosis not present

## 2023-01-18 DIAGNOSIS — K589 Irritable bowel syndrome without diarrhea: Secondary | ICD-10-CM | POA: Diagnosis not present

## 2023-01-20 DIAGNOSIS — K219 Gastro-esophageal reflux disease without esophagitis: Secondary | ICD-10-CM | POA: Diagnosis not present

## 2023-01-20 DIAGNOSIS — E039 Hypothyroidism, unspecified: Secondary | ICD-10-CM | POA: Diagnosis not present

## 2023-01-20 DIAGNOSIS — R600 Localized edema: Secondary | ICD-10-CM | POA: Diagnosis not present

## 2023-01-20 DIAGNOSIS — S72142D Displaced intertrochanteric fracture of left femur, subsequent encounter for closed fracture with routine healing: Secondary | ICD-10-CM | POA: Diagnosis not present

## 2023-01-20 DIAGNOSIS — I82432 Acute embolism and thrombosis of left popliteal vein: Secondary | ICD-10-CM | POA: Diagnosis not present

## 2023-01-20 DIAGNOSIS — I10 Essential (primary) hypertension: Secondary | ICD-10-CM | POA: Diagnosis not present

## 2023-01-23 DIAGNOSIS — L97509 Non-pressure chronic ulcer of other part of unspecified foot with unspecified severity: Secondary | ICD-10-CM | POA: Diagnosis not present

## 2023-01-25 DIAGNOSIS — K589 Irritable bowel syndrome without diarrhea: Secondary | ICD-10-CM | POA: Diagnosis not present

## 2023-01-25 DIAGNOSIS — I82432 Acute embolism and thrombosis of left popliteal vein: Secondary | ICD-10-CM | POA: Diagnosis not present

## 2023-01-25 DIAGNOSIS — I10 Essential (primary) hypertension: Secondary | ICD-10-CM | POA: Diagnosis not present

## 2023-01-25 DIAGNOSIS — S72142D Displaced intertrochanteric fracture of left femur, subsequent encounter for closed fracture with routine healing: Secondary | ICD-10-CM | POA: Diagnosis not present

## 2023-01-25 DIAGNOSIS — K219 Gastro-esophageal reflux disease without esophagitis: Secondary | ICD-10-CM | POA: Diagnosis not present

## 2023-01-26 DIAGNOSIS — S72142A Displaced intertrochanteric fracture of left femur, initial encounter for closed fracture: Secondary | ICD-10-CM | POA: Diagnosis not present

## 2023-01-27 DIAGNOSIS — I10 Essential (primary) hypertension: Secondary | ICD-10-CM | POA: Diagnosis not present

## 2023-01-27 DIAGNOSIS — I82432 Acute embolism and thrombosis of left popliteal vein: Secondary | ICD-10-CM | POA: Diagnosis not present

## 2023-01-27 DIAGNOSIS — I70211 Atherosclerosis of native arteries of extremities with intermittent claudication, right leg: Secondary | ICD-10-CM | POA: Diagnosis not present

## 2023-01-27 DIAGNOSIS — R638 Other symptoms and signs concerning food and fluid intake: Secondary | ICD-10-CM | POA: Diagnosis not present

## 2023-01-27 DIAGNOSIS — K580 Irritable bowel syndrome with diarrhea: Secondary | ICD-10-CM | POA: Diagnosis not present

## 2023-01-27 DIAGNOSIS — S72142D Displaced intertrochanteric fracture of left femur, subsequent encounter for closed fracture with routine healing: Secondary | ICD-10-CM | POA: Diagnosis not present

## 2023-01-30 DIAGNOSIS — I70211 Atherosclerosis of native arteries of extremities with intermittent claudication, right leg: Secondary | ICD-10-CM | POA: Diagnosis not present

## 2023-01-30 DIAGNOSIS — M1712 Unilateral primary osteoarthritis, left knee: Secondary | ICD-10-CM | POA: Diagnosis not present

## 2023-01-30 DIAGNOSIS — I82432 Acute embolism and thrombosis of left popliteal vein: Secondary | ICD-10-CM | POA: Diagnosis not present

## 2023-01-30 DIAGNOSIS — W19XXXD Unspecified fall, subsequent encounter: Secondary | ICD-10-CM | POA: Diagnosis not present

## 2023-01-30 DIAGNOSIS — I10 Essential (primary) hypertension: Secondary | ICD-10-CM | POA: Diagnosis not present

## 2023-01-30 DIAGNOSIS — S72142D Displaced intertrochanteric fracture of left femur, subsequent encounter for closed fracture with routine healing: Secondary | ICD-10-CM | POA: Diagnosis not present

## 2023-01-30 DIAGNOSIS — D62 Acute posthemorrhagic anemia: Secondary | ICD-10-CM | POA: Diagnosis not present

## 2023-01-30 DIAGNOSIS — K219 Gastro-esophageal reflux disease without esophagitis: Secondary | ICD-10-CM | POA: Diagnosis not present

## 2023-01-30 DIAGNOSIS — R638 Other symptoms and signs concerning food and fluid intake: Secondary | ICD-10-CM | POA: Diagnosis not present

## 2023-01-30 DIAGNOSIS — K580 Irritable bowel syndrome with diarrhea: Secondary | ICD-10-CM | POA: Diagnosis not present

## 2023-01-30 DIAGNOSIS — E039 Hypothyroidism, unspecified: Secondary | ICD-10-CM | POA: Diagnosis not present

## 2023-02-01 DIAGNOSIS — I10 Essential (primary) hypertension: Secondary | ICD-10-CM | POA: Diagnosis not present

## 2023-02-01 DIAGNOSIS — I739 Peripheral vascular disease, unspecified: Secondary | ICD-10-CM | POA: Diagnosis not present

## 2023-02-01 DIAGNOSIS — K589 Irritable bowel syndrome without diarrhea: Secondary | ICD-10-CM | POA: Diagnosis not present

## 2023-02-01 DIAGNOSIS — W19XXXD Unspecified fall, subsequent encounter: Secondary | ICD-10-CM | POA: Diagnosis not present

## 2023-02-01 DIAGNOSIS — E039 Hypothyroidism, unspecified: Secondary | ICD-10-CM | POA: Diagnosis not present

## 2023-02-01 DIAGNOSIS — Z90710 Acquired absence of both cervix and uterus: Secondary | ICD-10-CM | POA: Diagnosis not present

## 2023-02-01 DIAGNOSIS — Z96651 Presence of right artificial knee joint: Secondary | ICD-10-CM | POA: Diagnosis not present

## 2023-02-01 DIAGNOSIS — I82432 Acute embolism and thrombosis of left popliteal vein: Secondary | ICD-10-CM | POA: Diagnosis not present

## 2023-02-01 DIAGNOSIS — Z9181 History of falling: Secondary | ICD-10-CM | POA: Diagnosis not present

## 2023-02-01 DIAGNOSIS — S72142D Displaced intertrochanteric fracture of left femur, subsequent encounter for closed fracture with routine healing: Secondary | ICD-10-CM | POA: Diagnosis not present

## 2023-02-01 DIAGNOSIS — D649 Anemia, unspecified: Secondary | ICD-10-CM | POA: Diagnosis not present

## 2023-02-01 DIAGNOSIS — Z7901 Long term (current) use of anticoagulants: Secondary | ICD-10-CM | POA: Diagnosis not present

## 2023-02-01 DIAGNOSIS — H9193 Unspecified hearing loss, bilateral: Secondary | ICD-10-CM | POA: Diagnosis not present

## 2023-02-01 DIAGNOSIS — K219 Gastro-esophageal reflux disease without esophagitis: Secondary | ICD-10-CM | POA: Diagnosis not present

## 2023-02-01 DIAGNOSIS — M810 Age-related osteoporosis without current pathological fracture: Secondary | ICD-10-CM | POA: Diagnosis not present

## 2023-02-01 DIAGNOSIS — Z7982 Long term (current) use of aspirin: Secondary | ICD-10-CM | POA: Diagnosis not present

## 2023-02-01 DIAGNOSIS — Z87891 Personal history of nicotine dependence: Secondary | ICD-10-CM | POA: Diagnosis not present

## 2023-02-02 DIAGNOSIS — I739 Peripheral vascular disease, unspecified: Secondary | ICD-10-CM | POA: Diagnosis not present

## 2023-02-02 DIAGNOSIS — I10 Essential (primary) hypertension: Secondary | ICD-10-CM | POA: Diagnosis not present

## 2023-02-02 DIAGNOSIS — E039 Hypothyroidism, unspecified: Secondary | ICD-10-CM | POA: Diagnosis not present

## 2023-02-02 DIAGNOSIS — D649 Anemia, unspecified: Secondary | ICD-10-CM | POA: Diagnosis not present

## 2023-02-02 DIAGNOSIS — I82432 Acute embolism and thrombosis of left popliteal vein: Secondary | ICD-10-CM | POA: Diagnosis not present

## 2023-02-02 DIAGNOSIS — S72142D Displaced intertrochanteric fracture of left femur, subsequent encounter for closed fracture with routine healing: Secondary | ICD-10-CM | POA: Diagnosis not present

## 2023-02-07 DIAGNOSIS — D649 Anemia, unspecified: Secondary | ICD-10-CM | POA: Diagnosis not present

## 2023-02-07 DIAGNOSIS — E039 Hypothyroidism, unspecified: Secondary | ICD-10-CM | POA: Diagnosis not present

## 2023-02-07 DIAGNOSIS — I10 Essential (primary) hypertension: Secondary | ICD-10-CM | POA: Diagnosis not present

## 2023-02-07 DIAGNOSIS — I82432 Acute embolism and thrombosis of left popliteal vein: Secondary | ICD-10-CM | POA: Diagnosis not present

## 2023-02-07 DIAGNOSIS — I739 Peripheral vascular disease, unspecified: Secondary | ICD-10-CM | POA: Diagnosis not present

## 2023-02-07 DIAGNOSIS — S72142D Displaced intertrochanteric fracture of left femur, subsequent encounter for closed fracture with routine healing: Secondary | ICD-10-CM | POA: Diagnosis not present

## 2023-02-09 DIAGNOSIS — I10 Essential (primary) hypertension: Secondary | ICD-10-CM | POA: Diagnosis not present

## 2023-02-09 DIAGNOSIS — I82432 Acute embolism and thrombosis of left popliteal vein: Secondary | ICD-10-CM | POA: Diagnosis not present

## 2023-02-09 DIAGNOSIS — I739 Peripheral vascular disease, unspecified: Secondary | ICD-10-CM | POA: Diagnosis not present

## 2023-02-09 DIAGNOSIS — D649 Anemia, unspecified: Secondary | ICD-10-CM | POA: Diagnosis not present

## 2023-02-09 DIAGNOSIS — E039 Hypothyroidism, unspecified: Secondary | ICD-10-CM | POA: Diagnosis not present

## 2023-02-09 DIAGNOSIS — S72142D Displaced intertrochanteric fracture of left femur, subsequent encounter for closed fracture with routine healing: Secondary | ICD-10-CM | POA: Diagnosis not present

## 2023-02-10 DIAGNOSIS — E039 Hypothyroidism, unspecified: Secondary | ICD-10-CM | POA: Diagnosis not present

## 2023-02-10 DIAGNOSIS — I10 Essential (primary) hypertension: Secondary | ICD-10-CM | POA: Diagnosis not present

## 2023-02-10 DIAGNOSIS — D649 Anemia, unspecified: Secondary | ICD-10-CM | POA: Diagnosis not present

## 2023-02-10 DIAGNOSIS — S72142D Displaced intertrochanteric fracture of left femur, subsequent encounter for closed fracture with routine healing: Secondary | ICD-10-CM | POA: Diagnosis not present

## 2023-02-10 DIAGNOSIS — I739 Peripheral vascular disease, unspecified: Secondary | ICD-10-CM | POA: Diagnosis not present

## 2023-02-10 DIAGNOSIS — I82432 Acute embolism and thrombosis of left popliteal vein: Secondary | ICD-10-CM | POA: Diagnosis not present

## 2023-02-13 DIAGNOSIS — Z23 Encounter for immunization: Secondary | ICD-10-CM | POA: Diagnosis not present

## 2023-02-13 DIAGNOSIS — G309 Alzheimer's disease, unspecified: Secondary | ICD-10-CM | POA: Diagnosis not present

## 2023-02-13 DIAGNOSIS — D649 Anemia, unspecified: Secondary | ICD-10-CM | POA: Diagnosis not present

## 2023-02-13 DIAGNOSIS — M810 Age-related osteoporosis without current pathological fracture: Secondary | ICD-10-CM | POA: Diagnosis not present

## 2023-02-14 DIAGNOSIS — S72142D Displaced intertrochanteric fracture of left femur, subsequent encounter for closed fracture with routine healing: Secondary | ICD-10-CM | POA: Diagnosis not present

## 2023-02-14 DIAGNOSIS — I10 Essential (primary) hypertension: Secondary | ICD-10-CM | POA: Diagnosis not present

## 2023-02-14 DIAGNOSIS — E039 Hypothyroidism, unspecified: Secondary | ICD-10-CM | POA: Diagnosis not present

## 2023-02-14 DIAGNOSIS — D649 Anemia, unspecified: Secondary | ICD-10-CM | POA: Diagnosis not present

## 2023-02-14 DIAGNOSIS — I82432 Acute embolism and thrombosis of left popliteal vein: Secondary | ICD-10-CM | POA: Diagnosis not present

## 2023-02-14 DIAGNOSIS — I739 Peripheral vascular disease, unspecified: Secondary | ICD-10-CM | POA: Diagnosis not present

## 2023-02-15 DIAGNOSIS — I10 Essential (primary) hypertension: Secondary | ICD-10-CM | POA: Diagnosis not present

## 2023-02-15 DIAGNOSIS — I82432 Acute embolism and thrombosis of left popliteal vein: Secondary | ICD-10-CM | POA: Diagnosis not present

## 2023-02-15 DIAGNOSIS — S72142D Displaced intertrochanteric fracture of left femur, subsequent encounter for closed fracture with routine healing: Secondary | ICD-10-CM | POA: Diagnosis not present

## 2023-02-15 DIAGNOSIS — D649 Anemia, unspecified: Secondary | ICD-10-CM | POA: Diagnosis not present

## 2023-02-15 DIAGNOSIS — I739 Peripheral vascular disease, unspecified: Secondary | ICD-10-CM | POA: Diagnosis not present

## 2023-02-15 DIAGNOSIS — E039 Hypothyroidism, unspecified: Secondary | ICD-10-CM | POA: Diagnosis not present

## 2023-02-16 DIAGNOSIS — I739 Peripheral vascular disease, unspecified: Secondary | ICD-10-CM | POA: Diagnosis not present

## 2023-02-16 DIAGNOSIS — I10 Essential (primary) hypertension: Secondary | ICD-10-CM | POA: Diagnosis not present

## 2023-02-16 DIAGNOSIS — S72142D Displaced intertrochanteric fracture of left femur, subsequent encounter for closed fracture with routine healing: Secondary | ICD-10-CM | POA: Diagnosis not present

## 2023-02-16 DIAGNOSIS — I82432 Acute embolism and thrombosis of left popliteal vein: Secondary | ICD-10-CM | POA: Diagnosis not present

## 2023-02-16 DIAGNOSIS — D649 Anemia, unspecified: Secondary | ICD-10-CM | POA: Diagnosis not present

## 2023-02-16 DIAGNOSIS — E039 Hypothyroidism, unspecified: Secondary | ICD-10-CM | POA: Diagnosis not present

## 2023-02-20 DIAGNOSIS — E039 Hypothyroidism, unspecified: Secondary | ICD-10-CM | POA: Diagnosis not present

## 2023-02-20 DIAGNOSIS — I10 Essential (primary) hypertension: Secondary | ICD-10-CM | POA: Diagnosis not present

## 2023-02-20 DIAGNOSIS — I739 Peripheral vascular disease, unspecified: Secondary | ICD-10-CM | POA: Diagnosis not present

## 2023-02-20 DIAGNOSIS — I82432 Acute embolism and thrombosis of left popliteal vein: Secondary | ICD-10-CM | POA: Diagnosis not present

## 2023-02-20 DIAGNOSIS — D649 Anemia, unspecified: Secondary | ICD-10-CM | POA: Diagnosis not present

## 2023-02-20 DIAGNOSIS — S72142D Displaced intertrochanteric fracture of left femur, subsequent encounter for closed fracture with routine healing: Secondary | ICD-10-CM | POA: Diagnosis not present

## 2023-02-22 DIAGNOSIS — I739 Peripheral vascular disease, unspecified: Secondary | ICD-10-CM | POA: Diagnosis not present

## 2023-02-22 DIAGNOSIS — I82432 Acute embolism and thrombosis of left popliteal vein: Secondary | ICD-10-CM | POA: Diagnosis not present

## 2023-02-22 DIAGNOSIS — D649 Anemia, unspecified: Secondary | ICD-10-CM | POA: Diagnosis not present

## 2023-02-22 DIAGNOSIS — I10 Essential (primary) hypertension: Secondary | ICD-10-CM | POA: Diagnosis not present

## 2023-02-22 DIAGNOSIS — S72142D Displaced intertrochanteric fracture of left femur, subsequent encounter for closed fracture with routine healing: Secondary | ICD-10-CM | POA: Diagnosis not present

## 2023-02-22 DIAGNOSIS — E039 Hypothyroidism, unspecified: Secondary | ICD-10-CM | POA: Diagnosis not present

## 2023-02-23 DIAGNOSIS — E039 Hypothyroidism, unspecified: Secondary | ICD-10-CM | POA: Diagnosis not present

## 2023-02-23 DIAGNOSIS — S72142D Displaced intertrochanteric fracture of left femur, subsequent encounter for closed fracture with routine healing: Secondary | ICD-10-CM | POA: Diagnosis not present

## 2023-02-23 DIAGNOSIS — D649 Anemia, unspecified: Secondary | ICD-10-CM | POA: Diagnosis not present

## 2023-02-23 DIAGNOSIS — I739 Peripheral vascular disease, unspecified: Secondary | ICD-10-CM | POA: Diagnosis not present

## 2023-02-23 DIAGNOSIS — S72142A Displaced intertrochanteric fracture of left femur, initial encounter for closed fracture: Secondary | ICD-10-CM | POA: Diagnosis not present

## 2023-02-23 DIAGNOSIS — I10 Essential (primary) hypertension: Secondary | ICD-10-CM | POA: Diagnosis not present

## 2023-02-23 DIAGNOSIS — I82432 Acute embolism and thrombosis of left popliteal vein: Secondary | ICD-10-CM | POA: Diagnosis not present

## 2023-03-01 DIAGNOSIS — I739 Peripheral vascular disease, unspecified: Secondary | ICD-10-CM | POA: Diagnosis not present

## 2023-03-01 DIAGNOSIS — I82432 Acute embolism and thrombosis of left popliteal vein: Secondary | ICD-10-CM | POA: Diagnosis not present

## 2023-03-01 DIAGNOSIS — E039 Hypothyroidism, unspecified: Secondary | ICD-10-CM | POA: Diagnosis not present

## 2023-03-01 DIAGNOSIS — S72142D Displaced intertrochanteric fracture of left femur, subsequent encounter for closed fracture with routine healing: Secondary | ICD-10-CM | POA: Diagnosis not present

## 2023-03-01 DIAGNOSIS — D649 Anemia, unspecified: Secondary | ICD-10-CM | POA: Diagnosis not present

## 2023-03-01 DIAGNOSIS — I10 Essential (primary) hypertension: Secondary | ICD-10-CM | POA: Diagnosis not present

## 2023-03-02 DIAGNOSIS — S72002A Fracture of unspecified part of neck of left femur, initial encounter for closed fracture: Secondary | ICD-10-CM | POA: Diagnosis not present

## 2023-03-02 DIAGNOSIS — M810 Age-related osteoporosis without current pathological fracture: Secondary | ICD-10-CM | POA: Diagnosis not present

## 2023-03-02 DIAGNOSIS — I739 Peripheral vascular disease, unspecified: Secondary | ICD-10-CM | POA: Diagnosis not present

## 2023-03-02 DIAGNOSIS — G309 Alzheimer's disease, unspecified: Secondary | ICD-10-CM | POA: Diagnosis not present

## 2023-03-02 DIAGNOSIS — I10 Essential (primary) hypertension: Secondary | ICD-10-CM | POA: Diagnosis not present

## 2023-03-02 DIAGNOSIS — E039 Hypothyroidism, unspecified: Secondary | ICD-10-CM | POA: Diagnosis not present

## 2023-03-02 DIAGNOSIS — D649 Anemia, unspecified: Secondary | ICD-10-CM | POA: Diagnosis not present

## 2023-03-02 DIAGNOSIS — S72142D Displaced intertrochanteric fracture of left femur, subsequent encounter for closed fracture with routine healing: Secondary | ICD-10-CM | POA: Diagnosis not present

## 2023-03-02 DIAGNOSIS — I82432 Acute embolism and thrombosis of left popliteal vein: Secondary | ICD-10-CM | POA: Diagnosis not present

## 2023-03-03 DIAGNOSIS — K219 Gastro-esophageal reflux disease without esophagitis: Secondary | ICD-10-CM | POA: Diagnosis not present

## 2023-03-03 DIAGNOSIS — Z7901 Long term (current) use of anticoagulants: Secondary | ICD-10-CM | POA: Diagnosis not present

## 2023-03-03 DIAGNOSIS — Z90710 Acquired absence of both cervix and uterus: Secondary | ICD-10-CM | POA: Diagnosis not present

## 2023-03-03 DIAGNOSIS — K589 Irritable bowel syndrome without diarrhea: Secondary | ICD-10-CM | POA: Diagnosis not present

## 2023-03-03 DIAGNOSIS — Z9181 History of falling: Secondary | ICD-10-CM | POA: Diagnosis not present

## 2023-03-03 DIAGNOSIS — E039 Hypothyroidism, unspecified: Secondary | ICD-10-CM | POA: Diagnosis not present

## 2023-03-03 DIAGNOSIS — Z7982 Long term (current) use of aspirin: Secondary | ICD-10-CM | POA: Diagnosis not present

## 2023-03-03 DIAGNOSIS — W19XXXD Unspecified fall, subsequent encounter: Secondary | ICD-10-CM | POA: Diagnosis not present

## 2023-03-03 DIAGNOSIS — M810 Age-related osteoporosis without current pathological fracture: Secondary | ICD-10-CM | POA: Diagnosis not present

## 2023-03-03 DIAGNOSIS — Z87891 Personal history of nicotine dependence: Secondary | ICD-10-CM | POA: Diagnosis not present

## 2023-03-03 DIAGNOSIS — I82432 Acute embolism and thrombosis of left popliteal vein: Secondary | ICD-10-CM | POA: Diagnosis not present

## 2023-03-03 DIAGNOSIS — D649 Anemia, unspecified: Secondary | ICD-10-CM | POA: Diagnosis not present

## 2023-03-03 DIAGNOSIS — Z96651 Presence of right artificial knee joint: Secondary | ICD-10-CM | POA: Diagnosis not present

## 2023-03-03 DIAGNOSIS — H9193 Unspecified hearing loss, bilateral: Secondary | ICD-10-CM | POA: Diagnosis not present

## 2023-03-03 DIAGNOSIS — I739 Peripheral vascular disease, unspecified: Secondary | ICD-10-CM | POA: Diagnosis not present

## 2023-03-03 DIAGNOSIS — I10 Essential (primary) hypertension: Secondary | ICD-10-CM | POA: Diagnosis not present

## 2023-03-03 DIAGNOSIS — S72142D Displaced intertrochanteric fracture of left femur, subsequent encounter for closed fracture with routine healing: Secondary | ICD-10-CM | POA: Diagnosis not present

## 2023-03-08 DIAGNOSIS — E039 Hypothyroidism, unspecified: Secondary | ICD-10-CM | POA: Diagnosis not present

## 2023-03-08 DIAGNOSIS — S72142D Displaced intertrochanteric fracture of left femur, subsequent encounter for closed fracture with routine healing: Secondary | ICD-10-CM | POA: Diagnosis not present

## 2023-03-08 DIAGNOSIS — I10 Essential (primary) hypertension: Secondary | ICD-10-CM | POA: Diagnosis not present

## 2023-03-08 DIAGNOSIS — I739 Peripheral vascular disease, unspecified: Secondary | ICD-10-CM | POA: Diagnosis not present

## 2023-03-08 DIAGNOSIS — D649 Anemia, unspecified: Secondary | ICD-10-CM | POA: Diagnosis not present

## 2023-03-08 DIAGNOSIS — I82432 Acute embolism and thrombosis of left popliteal vein: Secondary | ICD-10-CM | POA: Diagnosis not present

## 2023-03-09 DIAGNOSIS — S72142D Displaced intertrochanteric fracture of left femur, subsequent encounter for closed fracture with routine healing: Secondary | ICD-10-CM | POA: Diagnosis not present

## 2023-03-09 DIAGNOSIS — E039 Hypothyroidism, unspecified: Secondary | ICD-10-CM | POA: Diagnosis not present

## 2023-03-09 DIAGNOSIS — D649 Anemia, unspecified: Secondary | ICD-10-CM | POA: Diagnosis not present

## 2023-03-09 DIAGNOSIS — I739 Peripheral vascular disease, unspecified: Secondary | ICD-10-CM | POA: Diagnosis not present

## 2023-03-09 DIAGNOSIS — I10 Essential (primary) hypertension: Secondary | ICD-10-CM | POA: Diagnosis not present

## 2023-03-09 DIAGNOSIS — I82432 Acute embolism and thrombosis of left popliteal vein: Secondary | ICD-10-CM | POA: Diagnosis not present

## 2023-03-10 DIAGNOSIS — I739 Peripheral vascular disease, unspecified: Secondary | ICD-10-CM | POA: Diagnosis not present

## 2023-03-10 DIAGNOSIS — I82432 Acute embolism and thrombosis of left popliteal vein: Secondary | ICD-10-CM | POA: Diagnosis not present

## 2023-03-10 DIAGNOSIS — E039 Hypothyroidism, unspecified: Secondary | ICD-10-CM | POA: Diagnosis not present

## 2023-03-10 DIAGNOSIS — S72142D Displaced intertrochanteric fracture of left femur, subsequent encounter for closed fracture with routine healing: Secondary | ICD-10-CM | POA: Diagnosis not present

## 2023-03-10 DIAGNOSIS — D649 Anemia, unspecified: Secondary | ICD-10-CM | POA: Diagnosis not present

## 2023-03-10 DIAGNOSIS — I10 Essential (primary) hypertension: Secondary | ICD-10-CM | POA: Diagnosis not present

## 2023-03-15 DIAGNOSIS — I82432 Acute embolism and thrombosis of left popliteal vein: Secondary | ICD-10-CM | POA: Diagnosis not present

## 2023-03-15 DIAGNOSIS — E039 Hypothyroidism, unspecified: Secondary | ICD-10-CM | POA: Diagnosis not present

## 2023-03-15 DIAGNOSIS — I739 Peripheral vascular disease, unspecified: Secondary | ICD-10-CM | POA: Diagnosis not present

## 2023-03-15 DIAGNOSIS — D649 Anemia, unspecified: Secondary | ICD-10-CM | POA: Diagnosis not present

## 2023-03-15 DIAGNOSIS — I10 Essential (primary) hypertension: Secondary | ICD-10-CM | POA: Diagnosis not present

## 2023-03-15 DIAGNOSIS — S72142D Displaced intertrochanteric fracture of left femur, subsequent encounter for closed fracture with routine healing: Secondary | ICD-10-CM | POA: Diagnosis not present

## 2023-03-17 DIAGNOSIS — I10 Essential (primary) hypertension: Secondary | ICD-10-CM | POA: Diagnosis not present

## 2023-03-17 DIAGNOSIS — I739 Peripheral vascular disease, unspecified: Secondary | ICD-10-CM | POA: Diagnosis not present

## 2023-03-17 DIAGNOSIS — D649 Anemia, unspecified: Secondary | ICD-10-CM | POA: Diagnosis not present

## 2023-03-17 DIAGNOSIS — I82432 Acute embolism and thrombosis of left popliteal vein: Secondary | ICD-10-CM | POA: Diagnosis not present

## 2023-03-17 DIAGNOSIS — E039 Hypothyroidism, unspecified: Secondary | ICD-10-CM | POA: Diagnosis not present

## 2023-03-17 DIAGNOSIS — S72142D Displaced intertrochanteric fracture of left femur, subsequent encounter for closed fracture with routine healing: Secondary | ICD-10-CM | POA: Diagnosis not present

## 2023-03-20 DIAGNOSIS — I82432 Acute embolism and thrombosis of left popliteal vein: Secondary | ICD-10-CM | POA: Diagnosis not present

## 2023-03-20 DIAGNOSIS — S72142D Displaced intertrochanteric fracture of left femur, subsequent encounter for closed fracture with routine healing: Secondary | ICD-10-CM | POA: Diagnosis not present

## 2023-03-20 DIAGNOSIS — D649 Anemia, unspecified: Secondary | ICD-10-CM | POA: Diagnosis not present

## 2023-03-20 DIAGNOSIS — I739 Peripheral vascular disease, unspecified: Secondary | ICD-10-CM | POA: Diagnosis not present

## 2023-03-20 DIAGNOSIS — E039 Hypothyroidism, unspecified: Secondary | ICD-10-CM | POA: Diagnosis not present

## 2023-03-20 DIAGNOSIS — I10 Essential (primary) hypertension: Secondary | ICD-10-CM | POA: Diagnosis not present

## 2023-03-22 DIAGNOSIS — I10 Essential (primary) hypertension: Secondary | ICD-10-CM | POA: Diagnosis not present

## 2023-03-22 DIAGNOSIS — D649 Anemia, unspecified: Secondary | ICD-10-CM | POA: Diagnosis not present

## 2023-03-22 DIAGNOSIS — E039 Hypothyroidism, unspecified: Secondary | ICD-10-CM | POA: Diagnosis not present

## 2023-03-22 DIAGNOSIS — S72142D Displaced intertrochanteric fracture of left femur, subsequent encounter for closed fracture with routine healing: Secondary | ICD-10-CM | POA: Diagnosis not present

## 2023-03-22 DIAGNOSIS — I82432 Acute embolism and thrombosis of left popliteal vein: Secondary | ICD-10-CM | POA: Diagnosis not present

## 2023-03-22 DIAGNOSIS — I739 Peripheral vascular disease, unspecified: Secondary | ICD-10-CM | POA: Diagnosis not present

## 2023-03-27 DIAGNOSIS — I739 Peripheral vascular disease, unspecified: Secondary | ICD-10-CM | POA: Diagnosis not present

## 2023-03-27 DIAGNOSIS — E039 Hypothyroidism, unspecified: Secondary | ICD-10-CM | POA: Diagnosis not present

## 2023-03-27 DIAGNOSIS — I10 Essential (primary) hypertension: Secondary | ICD-10-CM | POA: Diagnosis not present

## 2023-03-27 DIAGNOSIS — I82432 Acute embolism and thrombosis of left popliteal vein: Secondary | ICD-10-CM | POA: Diagnosis not present

## 2023-03-27 DIAGNOSIS — S72142D Displaced intertrochanteric fracture of left femur, subsequent encounter for closed fracture with routine healing: Secondary | ICD-10-CM | POA: Diagnosis not present

## 2023-03-27 DIAGNOSIS — D649 Anemia, unspecified: Secondary | ICD-10-CM | POA: Diagnosis not present

## 2023-03-30 DIAGNOSIS — I82432 Acute embolism and thrombosis of left popliteal vein: Secondary | ICD-10-CM | POA: Diagnosis not present

## 2023-03-30 DIAGNOSIS — S72142D Displaced intertrochanteric fracture of left femur, subsequent encounter for closed fracture with routine healing: Secondary | ICD-10-CM | POA: Diagnosis not present

## 2023-03-30 DIAGNOSIS — E039 Hypothyroidism, unspecified: Secondary | ICD-10-CM | POA: Diagnosis not present

## 2023-03-30 DIAGNOSIS — D649 Anemia, unspecified: Secondary | ICD-10-CM | POA: Diagnosis not present

## 2023-03-30 DIAGNOSIS — I10 Essential (primary) hypertension: Secondary | ICD-10-CM | POA: Diagnosis not present

## 2023-03-30 DIAGNOSIS — I739 Peripheral vascular disease, unspecified: Secondary | ICD-10-CM | POA: Diagnosis not present

## 2023-04-04 DIAGNOSIS — D649 Anemia, unspecified: Secondary | ICD-10-CM | POA: Diagnosis not present

## 2023-04-04 DIAGNOSIS — M810 Age-related osteoporosis without current pathological fracture: Secondary | ICD-10-CM | POA: Diagnosis not present

## 2023-04-04 DIAGNOSIS — I739 Peripheral vascular disease, unspecified: Secondary | ICD-10-CM | POA: Diagnosis not present

## 2023-04-04 DIAGNOSIS — I82432 Acute embolism and thrombosis of left popliteal vein: Secondary | ICD-10-CM | POA: Diagnosis not present

## 2023-04-04 DIAGNOSIS — I10 Essential (primary) hypertension: Secondary | ICD-10-CM | POA: Diagnosis not present

## 2023-04-04 DIAGNOSIS — E039 Hypothyroidism, unspecified: Secondary | ICD-10-CM | POA: Diagnosis not present

## 2023-04-04 DIAGNOSIS — S72142D Displaced intertrochanteric fracture of left femur, subsequent encounter for closed fracture with routine healing: Secondary | ICD-10-CM | POA: Diagnosis not present

## 2023-04-04 DIAGNOSIS — K589 Irritable bowel syndrome without diarrhea: Secondary | ICD-10-CM | POA: Diagnosis not present

## 2023-04-06 DIAGNOSIS — S72142A Displaced intertrochanteric fracture of left femur, initial encounter for closed fracture: Secondary | ICD-10-CM | POA: Diagnosis not present

## 2023-04-14 DIAGNOSIS — K2100 Gastro-esophageal reflux disease with esophagitis, without bleeding: Secondary | ICD-10-CM | POA: Diagnosis not present

## 2023-04-14 DIAGNOSIS — K580 Irritable bowel syndrome with diarrhea: Secondary | ICD-10-CM | POA: Diagnosis not present

## 2023-04-14 DIAGNOSIS — G309 Alzheimer's disease, unspecified: Secondary | ICD-10-CM | POA: Diagnosis not present

## 2023-04-14 DIAGNOSIS — I10 Essential (primary) hypertension: Secondary | ICD-10-CM | POA: Diagnosis not present

## 2023-04-14 DIAGNOSIS — J301 Allergic rhinitis due to pollen: Secondary | ICD-10-CM | POA: Diagnosis not present

## 2023-04-14 DIAGNOSIS — E7849 Other hyperlipidemia: Secondary | ICD-10-CM | POA: Diagnosis not present

## 2023-04-14 DIAGNOSIS — M810 Age-related osteoporosis without current pathological fracture: Secondary | ICD-10-CM | POA: Diagnosis not present

## 2023-04-14 DIAGNOSIS — I739 Peripheral vascular disease, unspecified: Secondary | ICD-10-CM | POA: Diagnosis not present

## 2023-04-14 DIAGNOSIS — M170 Bilateral primary osteoarthritis of knee: Secondary | ICD-10-CM | POA: Diagnosis not present

## 2023-04-14 DIAGNOSIS — Z23 Encounter for immunization: Secondary | ICD-10-CM | POA: Diagnosis not present

## 2023-04-14 DIAGNOSIS — I7300 Raynaud's syndrome without gangrene: Secondary | ICD-10-CM | POA: Diagnosis not present

## 2023-04-14 DIAGNOSIS — E039 Hypothyroidism, unspecified: Secondary | ICD-10-CM | POA: Diagnosis not present

## 2023-04-27 ENCOUNTER — Other Ambulatory Visit: Payer: Self-pay | Admitting: Family Medicine

## 2023-04-27 DIAGNOSIS — Z1231 Encounter for screening mammogram for malignant neoplasm of breast: Secondary | ICD-10-CM

## 2023-05-08 DIAGNOSIS — L814 Other melanin hyperpigmentation: Secondary | ICD-10-CM | POA: Diagnosis not present

## 2023-05-08 DIAGNOSIS — Z85828 Personal history of other malignant neoplasm of skin: Secondary | ICD-10-CM | POA: Diagnosis not present

## 2023-05-08 DIAGNOSIS — L821 Other seborrheic keratosis: Secondary | ICD-10-CM | POA: Diagnosis not present

## 2023-06-05 ENCOUNTER — Ambulatory Visit
Admission: RE | Admit: 2023-06-05 | Discharge: 2023-06-05 | Disposition: A | Discharge status: Home / Self Care | Payer: Medicare Other | Source: Ambulatory Visit | Attending: Family Medicine | Admitting: Family Medicine

## 2023-06-05 DIAGNOSIS — Z1231 Encounter for screening mammogram for malignant neoplasm of breast: Secondary | ICD-10-CM | POA: Insufficient documentation

## 2023-06-15 DIAGNOSIS — H04123 Dry eye syndrome of bilateral lacrimal glands: Secondary | ICD-10-CM | POA: Diagnosis not present

## 2023-06-15 DIAGNOSIS — H18523 Epithelial (juvenile) corneal dystrophy, bilateral: Secondary | ICD-10-CM | POA: Diagnosis not present

## 2023-06-28 DIAGNOSIS — L820 Inflamed seborrheic keratosis: Secondary | ICD-10-CM | POA: Diagnosis not present

## 2023-09-28 DIAGNOSIS — L821 Other seborrheic keratosis: Secondary | ICD-10-CM | POA: Diagnosis not present

## 2023-09-28 DIAGNOSIS — B078 Other viral warts: Secondary | ICD-10-CM | POA: Diagnosis not present

## 2023-09-28 DIAGNOSIS — D485 Neoplasm of uncertain behavior of skin: Secondary | ICD-10-CM | POA: Diagnosis not present

## 2023-11-01 ENCOUNTER — Other Ambulatory Visit
Admission: RE | Admit: 2023-11-01 | Discharge: 2023-11-01 | Disposition: A | Discharge status: Home / Self Care | Source: Ambulatory Visit | Attending: Family Medicine | Admitting: Family Medicine

## 2023-11-01 DIAGNOSIS — E785 Hyperlipidemia, unspecified: Secondary | ICD-10-CM | POA: Diagnosis not present

## 2023-11-01 DIAGNOSIS — R7301 Impaired fasting glucose: Secondary | ICD-10-CM | POA: Diagnosis not present

## 2023-11-01 DIAGNOSIS — E039 Hypothyroidism, unspecified: Secondary | ICD-10-CM | POA: Insufficient documentation

## 2023-11-01 LAB — COMPREHENSIVE METABOLIC PANEL WITH GFR
ALT: 11 U/L (ref 0–44)
AST: 19 U/L (ref 15–41)
Albumin: 3.5 g/dL (ref 3.5–5.0)
Alkaline Phosphatase: 65 U/L (ref 38–126)
Anion gap: 7 (ref 5–15)
BUN: 16 mg/dL (ref 8–23)
CO2: 26 mmol/L (ref 22–32)
Calcium: 8.9 mg/dL (ref 8.9–10.3)
Chloride: 107 mmol/L (ref 98–111)
Creatinine, Ser: 0.88 mg/dL (ref 0.44–1.00)
GFR, Estimated: >60 mL/min (ref >60)
Glucose, Bld: 104 mg/dL — ABNORMAL HIGH (ref 70–99)
Potassium: 3.8 mmol/L (ref 3.5–5.1)
Sodium: 140 mmol/L (ref 135–145)
Total Bilirubin: 0.5 mg/dL (ref 0.0–1.2)
Total Protein: 6.5 g/dL (ref 6.5–8.1)

## 2023-11-01 LAB — LIPID PANEL
Cholesterol: 167 mg/dL (ref 0–200)
HDL: 51 mg/dL (ref >40)
LDL Cholesterol: 83 mg/dL (ref 0–99)
Total CHOL/HDL Ratio: 3.3 ratio
Triglycerides: 165 mg/dL — ABNORMAL HIGH (ref <150)
VLDL: 33 mg/dL (ref 0–40)

## 2023-11-01 LAB — TSH: TSH: 1.374 u[IU]/mL (ref 0.350–4.500)

## 2023-11-06 DIAGNOSIS — M810 Age-related osteoporosis without current pathological fracture: Secondary | ICD-10-CM | POA: Diagnosis not present

## 2023-11-06 DIAGNOSIS — Z1331 Encounter for screening for depression: Secondary | ICD-10-CM | POA: Diagnosis not present

## 2023-11-06 DIAGNOSIS — I739 Peripheral vascular disease, unspecified: Secondary | ICD-10-CM | POA: Diagnosis not present

## 2023-11-06 DIAGNOSIS — K2100 Gastro-esophageal reflux disease with esophagitis, without bleeding: Secondary | ICD-10-CM | POA: Diagnosis not present

## 2023-11-06 DIAGNOSIS — Z0001 Encounter for general adult medical examination with abnormal findings: Secondary | ICD-10-CM | POA: Diagnosis not present

## 2023-11-06 DIAGNOSIS — Z6830 Body mass index (BMI) 30.0-30.9, adult: Secondary | ICD-10-CM | POA: Diagnosis not present

## 2023-11-06 DIAGNOSIS — G309 Alzheimer's disease, unspecified: Secondary | ICD-10-CM | POA: Diagnosis not present

## 2023-11-06 DIAGNOSIS — Z1389 Encounter for screening for other disorder: Secondary | ICD-10-CM | POA: Diagnosis not present

## 2023-12-21 DIAGNOSIS — I10 Essential (primary) hypertension: Secondary | ICD-10-CM | POA: Diagnosis not present

## 2023-12-21 DIAGNOSIS — E782 Mixed hyperlipidemia: Secondary | ICD-10-CM | POA: Diagnosis not present

## 2023-12-21 DIAGNOSIS — E039 Hypothyroidism, unspecified: Secondary | ICD-10-CM | POA: Diagnosis not present

## 2023-12-21 DIAGNOSIS — K580 Irritable bowel syndrome with diarrhea: Secondary | ICD-10-CM | POA: Diagnosis not present

## 2023-12-27 DIAGNOSIS — I739 Peripheral vascular disease, unspecified: Secondary | ICD-10-CM | POA: Diagnosis not present

## 2023-12-27 DIAGNOSIS — G309 Alzheimer's disease, unspecified: Secondary | ICD-10-CM | POA: Diagnosis not present

## 2023-12-27 DIAGNOSIS — E039 Hypothyroidism, unspecified: Secondary | ICD-10-CM | POA: Diagnosis not present

## 2023-12-27 DIAGNOSIS — R609 Edema, unspecified: Secondary | ICD-10-CM | POA: Diagnosis not present

## 2023-12-27 DIAGNOSIS — F0280 Dementia in other diseases classified elsewhere without behavioral disturbance: Secondary | ICD-10-CM | POA: Diagnosis not present

## 2023-12-27 DIAGNOSIS — M810 Age-related osteoporosis without current pathological fracture: Secondary | ICD-10-CM | POA: Diagnosis not present

## 2023-12-27 DIAGNOSIS — E782 Mixed hyperlipidemia: Secondary | ICD-10-CM | POA: Diagnosis not present

## 2023-12-27 DIAGNOSIS — Z8781 Personal history of (healed) traumatic fracture: Secondary | ICD-10-CM | POA: Diagnosis not present

## 2023-12-27 DIAGNOSIS — I10 Essential (primary) hypertension: Secondary | ICD-10-CM | POA: Diagnosis not present

## 2023-12-27 DIAGNOSIS — K580 Irritable bowel syndrome with diarrhea: Secondary | ICD-10-CM | POA: Diagnosis not present

## 2023-12-29 DIAGNOSIS — H04123 Dry eye syndrome of bilateral lacrimal glands: Secondary | ICD-10-CM | POA: Diagnosis not present

## 2023-12-29 DIAGNOSIS — H26492 Other secondary cataract, left eye: Secondary | ICD-10-CM | POA: Diagnosis not present

## 2024-01-26 DIAGNOSIS — I10 Essential (primary) hypertension: Secondary | ICD-10-CM | POA: Diagnosis not present

## 2024-01-26 DIAGNOSIS — K580 Irritable bowel syndrome with diarrhea: Secondary | ICD-10-CM | POA: Diagnosis not present

## 2024-01-26 DIAGNOSIS — M810 Age-related osteoporosis without current pathological fracture: Secondary | ICD-10-CM | POA: Diagnosis not present

## 2024-01-26 DIAGNOSIS — E782 Mixed hyperlipidemia: Secondary | ICD-10-CM | POA: Diagnosis not present

## 2024-01-26 DIAGNOSIS — E039 Hypothyroidism, unspecified: Secondary | ICD-10-CM | POA: Diagnosis not present

## 2024-01-26 DIAGNOSIS — F0280 Dementia in other diseases classified elsewhere without behavioral disturbance: Secondary | ICD-10-CM | POA: Diagnosis not present

## 2024-01-26 DIAGNOSIS — I739 Peripheral vascular disease, unspecified: Secondary | ICD-10-CM | POA: Diagnosis not present

## 2024-01-26 DIAGNOSIS — Z8781 Personal history of (healed) traumatic fracture: Secondary | ICD-10-CM | POA: Diagnosis not present

## 2024-01-26 DIAGNOSIS — G309 Alzheimer's disease, unspecified: Secondary | ICD-10-CM | POA: Diagnosis not present

## 2024-01-26 DIAGNOSIS — R609 Edema, unspecified: Secondary | ICD-10-CM | POA: Diagnosis not present

## 2024-02-20 DIAGNOSIS — I10 Essential (primary) hypertension: Secondary | ICD-10-CM | POA: Diagnosis not present

## 2024-02-20 DIAGNOSIS — E782 Mixed hyperlipidemia: Secondary | ICD-10-CM | POA: Diagnosis not present

## 2024-02-20 DIAGNOSIS — E039 Hypothyroidism, unspecified: Secondary | ICD-10-CM | POA: Diagnosis not present

## 2024-02-20 DIAGNOSIS — F0280 Dementia in other diseases classified elsewhere without behavioral disturbance: Secondary | ICD-10-CM | POA: Diagnosis not present

## 2024-02-23 DIAGNOSIS — M810 Age-related osteoporosis without current pathological fracture: Secondary | ICD-10-CM | POA: Diagnosis not present

## 2024-02-23 DIAGNOSIS — R609 Edema, unspecified: Secondary | ICD-10-CM | POA: Diagnosis not present

## 2024-02-23 DIAGNOSIS — E782 Mixed hyperlipidemia: Secondary | ICD-10-CM | POA: Diagnosis not present

## 2024-02-23 DIAGNOSIS — F0280 Dementia in other diseases classified elsewhere without behavioral disturbance: Secondary | ICD-10-CM | POA: Diagnosis not present

## 2024-02-23 DIAGNOSIS — G309 Alzheimer's disease, unspecified: Secondary | ICD-10-CM | POA: Diagnosis not present

## 2024-02-23 DIAGNOSIS — I10 Essential (primary) hypertension: Secondary | ICD-10-CM | POA: Diagnosis not present

## 2024-02-23 DIAGNOSIS — E039 Hypothyroidism, unspecified: Secondary | ICD-10-CM | POA: Diagnosis not present

## 2024-02-23 DIAGNOSIS — I739 Peripheral vascular disease, unspecified: Secondary | ICD-10-CM | POA: Diagnosis not present

## 2024-02-23 DIAGNOSIS — K580 Irritable bowel syndrome with diarrhea: Secondary | ICD-10-CM | POA: Diagnosis not present

## 2024-02-23 DIAGNOSIS — Z8781 Personal history of (healed) traumatic fracture: Secondary | ICD-10-CM | POA: Diagnosis not present

## 2024-02-27 DIAGNOSIS — R251 Tremor, unspecified: Secondary | ICD-10-CM | POA: Diagnosis not present

## 2024-02-27 DIAGNOSIS — R413 Other amnesia: Secondary | ICD-10-CM | POA: Diagnosis not present

## 2024-02-27 DIAGNOSIS — M62838 Other muscle spasm: Secondary | ICD-10-CM | POA: Diagnosis not present

## 2024-02-27 NOTE — Progress Notes (Signed)
 Today the history is gathered from: 80% - patient  20% - son, daughter and daughter in law  [name]  RECORDS SUMMARY: Patient here today for evaluation of tremors in bilateral hands and memory loss.  REFERRING PHYSICIAN: Toribio Jerel MATSU, MD PRIMARY CARE PHYSICIAN:  Toribio Jerel MATSU, MD  IMPRESSION/PLAN  Dorothy Glenn is a 89 y.o. female presenting for evaluation of  TREMORS/ MUSCLE SPASMS/ MEMORY LOSS/  - New to me. - Patient presenting with daily intermittent bilateral hand tremor worse with movement for about a year. Handwriting difficulty. No difficulty holding utensils, feeding utensils. Memory loss for about a year following fall and hip arthroplasty. Difficulty recalling names, words and will misplace items. Currently living with son, daughter and daughter in law. Son manages medication and finances.  - Reviewed TSH from 11/01/2023, 1.374. - Memory evaluation today is 19/23. Repeat in 4-6 months. - Can discuss adding on B12 lab with routine bloodwork at next primary care visit. - Start Gabapentin  (Neurontin ) 100 mg twice daily for one week, then increase to 200 mg twice daily for bilateral hand tremor. - Encouraged patient to stay physically active and exercise on regular basis (90 mins per week or 15 mins per day). Exercise can be very beneficial in many neurological conditions. Use walker at all times to reduce fall risk. - Encouraged patient to complete cognitive activities such as puzzles, games and reading for memory.  - At this time defer starting memory medication due to age. May consider at future visit, may consider Aricept 5 mg in the morning instead of at night due to insomnia. If patient is hesitant about Aricept, may consider Namenda or Galantamine. - May increase Gabapentin  at future visit if bilateral hand tremor persists.  Medications previously tried:  Follow-up with Dr. Lane in  3-4 months.  p=4  CHIEF COMPLAINT & HPI  Dorothy Glenn is a 89 y.o. female  presenting for evaluation of: Chief Complaint  Patient presents with  . Tremors  . Spasms  . Memory Loss    TREMORS/ MUSCLE SPASMS/ MEMORY LOSS/  Patient with bilateral hand tremor that comes and goes for about 1 year, has more bad days than good days. No head tremor, tremors in lower extremities. Tremors worsen with movement. Notes grip strength difficulty. Has difficulty handwriting. No difficulty holding utensils, feeding herself. Has had contractures in bilateral hands once in the past. Muscle spasms in bilateral hands. No Fhx of Parkinson's disease. No hypophonia, asymmetric stiffness. Memory loss for about a year following fall and hip arthroplasty. Difficulty recalling names and words. Misplacing items. No difficulty with complex tools. Actively completes puzzles and games. Family history of dementia, sisters. One sister had late onset dementia, other sister had early onset dementia. Tried Aricept 5 mg QHS for about 4 days, stopped due to insomnia. Ambulates with walker daily, here today in wheelchair. Completed PT for gait and balance last year at rehabilitation facility following hip arthroplasty. Seldom wearing hearing aids. Currently living with son, daughter and daughter in law. Medications and finances are managed by son. Memory evaluation today is 19/23.  DATA SUMMARY: 06/11/2022 CT HEAD WO IMPRESSION:  1. No acute intracranial pathology.  2. Mild age-related atrophy and chronic microvascular ischemic  changes.   VISIT SUMMARIES:   MEDICATIONS Current Outpatient Medications  Medication Sig Dispense Refill  . aspirin  81 MG EC tablet Take 81 mg by mouth once daily    . cholecalciferol 1000 unit tablet Take by mouth    . levothyroxine  (SYNTHROID )  50 MCG tablet Take 50 mcg by mouth once daily Take on an empty stomach with a glass of water at least 30-60 minutes before breakfast.    . loperamide (IMODIUM A-D) 2 mg tablet Take 2 mg by mouth as needed for Diarrhea    . metoprolol   SUCCinate (TOPROL -XL) 100 MG XL tablet Take 100 mg by mouth once daily    . MULTIVITAMIN ORAL Take by mouth    . olmesartan (BENICAR) 20 MG tablet Take 20 mg by mouth once daily    . omeprazole (PRILOSEC) 20 MG DR capsule Take 20 mg by mouth once daily     No current facility-administered medications for this visit.    ALLERGIES Allergies  Allergen Reactions  . Ciprofloxacin Nausea and Vomiting  . Meperidine  Nausea  . Raloxifene Nausea and Vomiting  . Tamoxifen Nausea and Vomiting     EXAM   Vitals:   02/27/24 1428  BP: 136/84  Weight: 87.1 kg (192 lb)  Height: 172.7 cm (5' 8)  PainSc: 0-No pain   Body mass index is 29.19 kg/m.  MEMORY EVALUATION: 02/27/2024 - 19/23 (she could not spell world backwards, skipped writing a sentence and drawing a picture due to bilateral hand tremor) 11/06/2023 - 24/30 (PCP)  GENERAL: Pleasant female.  NAD.  Normocephalic and atraumatic. Ambulating with wheelchair today.  MUSCULOSKELETAL: Bulk - Normal Tone - Normal Pronator Drift - Absent bilaterally. Ambulation - Gait and station are moderately unsteady, ambulating with wheelchair in clinic. Romberg - not assessed today, ambulating in wheelchair.  R/L 5/5    Shoulder abduction (deltoid/supraspinatus, axillary/suprascapular n, C5) 5/5    Elbow flexion (biceps brachii, musculoskeletal n, C5-6) 5/5    Elbow extension (triceps, radial n, C7) 5/5    Finger adduction (interossei, ulnar n, T1)  5/5    Hip flexion (iliopsoas, L1/L2) 5/5    Knee flexion (hamstrings, sciatic n, L5/S1)  5/5    Knee extension (quadriceps, femoral n, L3/4) 5/5    Ankle dorsiflexion (tibialis anterior, deep fibular n, L4/5) 5/5    Ankle plantarflexion (gastroc, tibial n, S1)   NEUROLOGICAL: MENTAL STATUS: Patient is oriented to person, place and time.  Recent memory is 19/23 (02/27/2024).  Remote memory is intact.  Attention span and concentration are intact.  Naming, repetition, comprehension and expressive  speech are within normal limits.  Patient's fund of knowledge is within normal limits for educational level.  CRANIAL NERVES: Normal    CN II (normal visual acuity and visual fields) Normal    CN III, IV, VI (extraocular muscles are intact) Normal    CN V (facial sensation is intact bilaterally) Normal    CN VII (facial strength is intact bilaterally) Normal    CN VIII (hearing is intact bilaterally) Normal    CN IX/X (palate elevates midline, normal phonation) Normal    CN XI (shoulder shrug strength is normal and symmetric) Normal    CN XII (tongue protrudes midline)  SENSATION: Intact to pain and temp bilaterally (spinothalamic tracts) Intact to position and vibration bilaterally (dorsal columns)  REFLEXES: R/L 2+/2+    Biceps 2+/2+    Brachioradialis  2+/2+    Patellar 2+/2+    Achilles  COORDINATION/CEREBELLAR: Finger to nose testing is within normal limits.     PAST MEDICAL HISTORY Past Medical History:  Diagnosis Date  . Arthritis   . BCC (basal cell carcinoma of skin)   . Breast cancer (CMS/HHS-HCC) 2006   left  . 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 Buschke-Lowenstein giant condyloma   . Chronic pain   . Complication of anesthesia   . Diverticulitis   . Dysrhythmia   . GERD (gastroesophageal reflux disease)   . Hemorrhoid   . Hepatitis    hepatitis A and mono  . Hypothyroidism   . IBS (irritable bowel syndrome)   . Infundibulocystic basal cell carcinoma (BCC)   . Measles    as a child  . Mumps    as a child  . Osteoporosis   . Pneumonia   . Pneumothorax   . PONV (postoperative nausea and vomiting)   . Scarlet fever    as a child  . Skin cancer   . Vision decreased     PAST SURGICAL HISTORY Past Surgical History:  Procedure Laterality Date  . TONSILLECTOMY  1938  . APPENDECTOMY  1942   breast biopsy and cyst removed Bilateral multiple times  . OTHER SURGERY Right 1956   HERNIA REPAIR; open  . HEMORRHOID SURGERY  1996   . ABDOMINAL HYSTERECTOMY  1998   complete, ovaries and cervix removed  . OTHER SURGERY Left 2006   lumpectomy  . spinal cypho plasty  2007   lower back  . ARTHROPLASTY TOTAL KNEE Right 03/04/2013  . BREAST EXCISIONAL BIOPSY Left 2018   Introductal Papilloma  . BREAST LUMPECTOMY WITH RADIOACTIVE SEED LOCALIZATION Left 02/22/2017  . ARTHROPLASTY HIP TOTAL  12/2022   reported by patient and relatives  . BREAST LUMPECTOMY  Left   . COLONOSCOPY    . moes procedure     2007 and 2014; areas removed from nose  . NASAL SEPTUM SURGERY     early 1970's    FAMILY HISTORY Family History  Problem Relation Name Age of Onset  . Breast cancer Mother    . Dementia Sister         late onset  . Dementia Sister         early onset    SOCIAL HISTORY  Social History   Tobacco Use  . Smoking status: Former    Current packs/day: 0.00    Types: Cigarettes    Quit date: 1971    Years since quitting: 54.8  . Smokeless tobacco: Never  Substance Use Topics  . Alcohol  use: Never  . Drug use: Never     REVIEW OF SYSTEMS:  13 system ROS form was given to the patient to complete and I have reviewed it.  The form was sent for scan to the patient's EHR.  Pertinent positives and negatives are mentioned above in the HPI and all other systems are negative.   DATA  I have personally reviewed all of the data outlined below both prior to the appointment and during the appointment with the patient as appropriate.  No visits with results within 6 Month(s) from this visit.  Latest known visit with results is:  No results found for any previous visit.      No follow-ups on file.  Payor: MEDICARE / Plan: MEDICARE A AND B / Product Type: Medicare /   This note is partially written by Greig Pouch, scribe, in the presence of and acting as the scribe of Dr. Arthea Farrow.  I have reviewed, edited and added to the note as needed to reflect my best personal medical judgment.    Dr. Arthea Farrow, MD [facility] [place], [place] Ph:  [phone] Fax:  [phone]

## 2024-03-12 DIAGNOSIS — R42 Dizziness and giddiness: Secondary | ICD-10-CM | POA: Diagnosis not present

## 2024-03-12 DIAGNOSIS — R519 Headache, unspecified: Secondary | ICD-10-CM | POA: Diagnosis not present

## 2024-03-12 DIAGNOSIS — R109 Unspecified abdominal pain: Secondary | ICD-10-CM | POA: Diagnosis not present

## 2024-03-19 DIAGNOSIS — M810 Age-related osteoporosis without current pathological fracture: Secondary | ICD-10-CM | POA: Diagnosis not present

## 2024-03-19 DIAGNOSIS — G309 Alzheimer's disease, unspecified: Secondary | ICD-10-CM | POA: Diagnosis not present

## 2024-03-19 DIAGNOSIS — E782 Mixed hyperlipidemia: Secondary | ICD-10-CM | POA: Diagnosis not present

## 2024-03-19 DIAGNOSIS — I739 Peripheral vascular disease, unspecified: Secondary | ICD-10-CM | POA: Diagnosis not present

## 2024-03-19 DIAGNOSIS — K580 Irritable bowel syndrome with diarrhea: Secondary | ICD-10-CM | POA: Diagnosis not present

## 2024-03-19 DIAGNOSIS — E039 Hypothyroidism, unspecified: Secondary | ICD-10-CM | POA: Diagnosis not present

## 2024-03-19 DIAGNOSIS — R609 Edema, unspecified: Secondary | ICD-10-CM | POA: Diagnosis not present

## 2024-03-19 DIAGNOSIS — F0280 Dementia in other diseases classified elsewhere without behavioral disturbance: Secondary | ICD-10-CM | POA: Diagnosis not present

## 2024-03-19 DIAGNOSIS — Z8781 Personal history of (healed) traumatic fracture: Secondary | ICD-10-CM | POA: Diagnosis not present

## 2024-03-19 DIAGNOSIS — I10 Essential (primary) hypertension: Secondary | ICD-10-CM | POA: Diagnosis not present

## 2024-03-22 DIAGNOSIS — E782 Mixed hyperlipidemia: Secondary | ICD-10-CM | POA: Diagnosis not present

## 2024-03-22 DIAGNOSIS — F0280 Dementia in other diseases classified elsewhere without behavioral disturbance: Secondary | ICD-10-CM | POA: Diagnosis not present

## 2024-03-22 DIAGNOSIS — I10 Essential (primary) hypertension: Secondary | ICD-10-CM | POA: Diagnosis not present

## 2024-03-22 DIAGNOSIS — K580 Irritable bowel syndrome with diarrhea: Secondary | ICD-10-CM | POA: Diagnosis not present

## 2024-04-10 ENCOUNTER — Other Ambulatory Visit: Payer: Self-pay

## 2024-04-10 ENCOUNTER — Emergency Department

## 2024-04-10 ENCOUNTER — Emergency Department
Admission: EM | Admit: 2024-04-10 | Discharge: 2024-04-10 | Disposition: A | Discharge status: Home / Self Care | Attending: Emergency Medicine | Admitting: Emergency Medicine

## 2024-04-10 DIAGNOSIS — M19042 Primary osteoarthritis, left hand: Secondary | ICD-10-CM | POA: Diagnosis not present

## 2024-04-10 DIAGNOSIS — I700 Atherosclerosis of aorta: Secondary | ICD-10-CM | POA: Diagnosis not present

## 2024-04-10 DIAGNOSIS — M549 Dorsalgia, unspecified: Secondary | ICD-10-CM | POA: Diagnosis not present

## 2024-04-10 DIAGNOSIS — Z85828 Personal history of other malignant neoplasm of skin: Secondary | ICD-10-CM | POA: Insufficient documentation

## 2024-04-10 DIAGNOSIS — M4317 Spondylolisthesis, lumbosacral region: Secondary | ICD-10-CM | POA: Diagnosis not present

## 2024-04-10 DIAGNOSIS — Z96651 Presence of right artificial knee joint: Secondary | ICD-10-CM | POA: Diagnosis not present

## 2024-04-10 DIAGNOSIS — M4186 Other forms of scoliosis, lumbar region: Secondary | ICD-10-CM | POA: Diagnosis not present

## 2024-04-10 DIAGNOSIS — S61412A Laceration without foreign body of left hand, initial encounter: Secondary | ICD-10-CM | POA: Insufficient documentation

## 2024-04-10 DIAGNOSIS — Z79899 Other long term (current) drug therapy: Secondary | ICD-10-CM | POA: Diagnosis not present

## 2024-04-10 DIAGNOSIS — Z853 Personal history of malignant neoplasm of breast: Secondary | ICD-10-CM | POA: Diagnosis not present

## 2024-04-10 DIAGNOSIS — M25552 Pain in left hip: Secondary | ICD-10-CM | POA: Diagnosis not present

## 2024-04-10 DIAGNOSIS — I10 Essential (primary) hypertension: Secondary | ICD-10-CM | POA: Insufficient documentation

## 2024-04-10 DIAGNOSIS — E039 Hypothyroidism, unspecified: Secondary | ICD-10-CM | POA: Diagnosis not present

## 2024-04-10 DIAGNOSIS — Z23 Encounter for immunization: Secondary | ICD-10-CM | POA: Diagnosis not present

## 2024-04-10 DIAGNOSIS — Y92002 Bathroom of unspecified non-institutional (private) residence single-family (private) house as the place of occurrence of the external cause: Secondary | ICD-10-CM | POA: Diagnosis not present

## 2024-04-10 DIAGNOSIS — M25562 Pain in left knee: Secondary | ICD-10-CM | POA: Insufficient documentation

## 2024-04-10 DIAGNOSIS — W010XXA Fall on same level from slipping, tripping and stumbling without subsequent striking against object, initial encounter: Secondary | ICD-10-CM | POA: Insufficient documentation

## 2024-04-10 DIAGNOSIS — M1712 Unilateral primary osteoarthritis, left knee: Secondary | ICD-10-CM | POA: Diagnosis not present

## 2024-04-10 DIAGNOSIS — Z043 Encounter for examination and observation following other accident: Secondary | ICD-10-CM | POA: Diagnosis not present

## 2024-04-10 DIAGNOSIS — M47814 Spondylosis without myelopathy or radiculopathy, thoracic region: Secondary | ICD-10-CM | POA: Diagnosis not present

## 2024-04-10 DIAGNOSIS — W19XXXA Unspecified fall, initial encounter: Secondary | ICD-10-CM

## 2024-04-10 DIAGNOSIS — S6992XA Unspecified injury of left wrist, hand and finger(s), initial encounter: Secondary | ICD-10-CM | POA: Diagnosis present

## 2024-04-10 DIAGNOSIS — J90 Pleural effusion, not elsewhere classified: Secondary | ICD-10-CM | POA: Diagnosis not present

## 2024-04-10 DIAGNOSIS — M47816 Spondylosis without myelopathy or radiculopathy, lumbar region: Secondary | ICD-10-CM | POA: Diagnosis not present

## 2024-04-10 MED ORDER — BACITRACIN ZINC 500 UNIT/GM EX OINT
TOPICAL_OINTMENT | Freq: Once | CUTANEOUS | Status: AC
  Filled 2024-04-10: qty 0.9

## 2024-04-10 MED ORDER — TETANUS-DIPHTH-ACELL PERTUSSIS 5-2-15.5 LF-MCG/0.5 IM SUSP
0.5000 mL | Freq: Once | INTRAMUSCULAR | Status: AC
  Administered 2024-04-10: 0.5 mL via INTRAMUSCULAR
  Filled 2024-04-10: qty 0.5

## 2024-04-10 MED ORDER — ACETAMINOPHEN 500 MG PO TABS: 1000.0000 mg | ORAL_TABLET | Freq: Once | ORAL | Status: DC
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
## 2024-04-10 NOTE — ED Triage Notes (Signed)
 Pt reports she lost her balance walking to the bathroom this morning, pt c/o left knee and lower back pain. Pt has skin tear to left hand. Denies hitting head, not on blood thinners

## 2024-04-10 NOTE — ED Notes (Signed)
 Pt transported to CT ?

## 2024-04-10 NOTE — ED Provider Notes (Signed)
 Care received by Dr. Neomi. Pending imaging from mechanical fall while using walker. Patient has L knee, hip, and buttock pain. Small skin tear hand. Denies any head injury.    Dorothy Anes, MD 04/10/24 [phone]

## 2024-04-10 NOTE — ED Provider Notes (Signed)
 Patient alert resting comfortably very pleasant.  Reports only having stinging discomfort over her left hand which is bandaged from skin tear.  Discussed with son and patient they are familiar with and we discussed bandaging wound care and wound checks of the hand.  Also discussed return precautions.  Her imaging very reassuring.  She since been up to ambulate back and forth to the bathroom and reports that she is feeling well ready to go home.  CT Thoracic Spine Wo Contrast Result Date: 04/10/2024 EXAM: CT THORACIC SPINE WITHOUT CONTRAST 04/10/2024 07:20:36 AM TECHNIQUE: CT of the thoracic spine was performed without the administration of intravenous contrast. Multiplanar reformatted images are provided for review. Automated exposure control, iterative reconstruction, and/or weight based adjustment of the mA/kV was utilized to reduce the radiation dose to as low as reasonably achievable. COMPARISON: CTA chest 11/19/2021. Lumbar spine CT 04/10/2024 reported separately. CLINICAL HISTORY: 89 year old female status post fall. FINDINGS: thoracic segmentation appears normal. Hypoplastic 12th ribs. BONES AND ALIGNMENT: Normal vertebral body heights. Chronic left posterior 8th rib fracture is stable from 2023. hyperostosis related thoracic interbody ankylosis at T8 T9 possibly also T3 T4 and T9 T10. Maintained thoracic vertebral height. No acute fracture or suspicious bone lesion. DEGENERATIVE CHANGES: Mild thoracic spine degeneration. No CT evidence of thoracic spinal stenosis. SOFT TISSUES: Negative thoracic paraspinal soft tissues. Chronic small narrowing left pleural effusion is stable since 2023. No pericardial effusion. Calcified atherosclerosis of the aorta. Mildly tortuous thoracic aorta. Calcified coronary artery atherosclerosis. Small narrowing chronic gallstones. Negative other visible non-contrast upper abdominal viscera. No acute abnormality. IMPRESSION: 1. No acute traumatic injury identified in the  thoracic spine. 2. Mild for age thoracic spine degeneration. 3. Small chronic left pleural effusion. Chronic gallstones. Calcified atherosclerosis. Electronically signed by: Helayne Hurst MD 04/10/2024 07:45 AM EST RP Workstation: [id number]   CT Lumbar Spine Wo Contrast Result Date: 04/10/2024 EXAM: CT OF THE LUMBAR SPINE WITHOUT CONTRAST 04/10/2024 07:20:36 AM TECHNIQUE: CT of the lumbar spine was performed without the administration of intravenous contrast. Multiplanar reformatted images are provided for review. Automated exposure control, iterative reconstruction, and/or weight based adjustment of the mA/kV was utilized to reduce the radiation dose to as low as reasonably achievable. COMPARISON: Thoracic CT reported separately today. CLINICAL HISTORY: 89 year old female. Back pain, fall, xray done. FINDINGS: BONES AND ALIGNMENT: Normal lumbar segmentation. Moderate dextroconvex upper lumbar and mild levoconvex lower lumbar scoliosis. Relatively maintained lordosis. Previously augmented L3 compression fracture. Underlying generalized osteopenia. Other lumbar vertebrae appear intact. Intact visible sacrum and SI joints. No acute traumatic injury identified in the lumbar spine. DEGENERATIVE CHANGES: Mild free lumbar spine degeneration. No significant lumbar spinal stenosis by CT. SOFT TISSUES: Small gravel type layering gallstones. Calcified aortoiliac atherosclerosis. Posterior pararenal space of fat in the posterior 11/12 rib intercostal space (series 5 image 14), hernia sac is about 5 cm diameter. Otherwise negative visible non-contrast left kidney. No regional soft tissue inflammation. Otherwise negative lumbar paraspinal soft tissues. Incidental Tarlov cysts of the sacrum. No acute abnormality. IMPRESSION: 1. No acute traumatic injury identified in the lumbar spine. 2. Lumbar scoliosis and previously augmented L3 compression fracture. Mild for age lumbar spine degeneration. 3. Left perirenal hernia of fat  through the posterior 11th / 12th intercostal space. Hernia is approximately 5 cm diameter, with no complicating features identified. 4. Layering Small gallstones. Calcified atherosclerosis. Electronically signed by: Helayne Hurst MD 04/10/2024 07:43 AM EST RP Workstation: [id number]   DG Hip Unilat W or Wo Pelvis 2-3  Views Left Result Date: 04/10/2024 EXAM: 2 or 3 VIEW(S) XRAY OF THE LEFT HIP 04/10/2024 06:55:00 AM COMPARISON: Comparison intraoperative images of the proximal left femur 01/08/2023. CLINICAL HISTORY: 89 year old female. Fall, left hip and knee pain, unable to stand. FINDINGS: BONES AND JOINTS: Proximal left femur ORIF hardware appears stable and intact. The hip joint is maintained. No significant degenerative changes. Partially visible previously augmented lumbar vertebral body. SOFT TISSUES: The soft tissues are unremarkable. BOWEL GAS: Nonobstructed bowel gas pattern. IMPRESSION: 1. No acute fracture or dislocation identified about the left hip or pelvis. 2. Stable proximal left femur ORIF hardware. Electronically signed by: Helayne Hurst MD 04/10/2024 07:06 AM EST RP Workstation: [id number]   DG Hand Complete Left Result Date: 04/10/2024 EXAM: 3 or more VIEW(S) XRAY OF THE LEFT HAND 04/10/2024 06:55:00 AM COMPARISON: None available. CLINICAL HISTORY: 89 year old female. Fall. FINDINGS: BONES AND JOINTS: No acute fracture. Radiocarpal joint space loss. 1st CMC moderate joint space loss and sclerosis. Generalized IP joint space loss in the hand. No joint dislocation. SOFT TISSUES: No discrete soft tissue injury. IMPRESSION: 1. No acute fracture or dislocation identified about the left hand. 2. Osteoarthritis. Electronically signed by: Helayne Hurst MD 04/10/2024 07:05 AM EST RP Workstation: [id number]   DG Lumbar Spine 2-3 Views Result Date: 04/10/2024 EXAM: 2 or 3 VIEW(S) XRAY OF THE LUMBAR SPINE 04/10/2024 06:07:00 AM COMPARISON: None available. CLINICAL HISTORY: fall fall FINDINGS: LUMBAR  SPINE: BONES: First-degree anterolisthesis of L5 on S1. There is a curvature of the lumbar spine which is convex towards the right. Mild L3 compression deformity is identified with signs of prior kyphoplasty. No acute fracture. No aggressive appearing osseous lesion. DISCS AND DEGENERATIVE CHANGES: Mild multilevel disc space narrowing with endplate degenerative changes. Lower lumbar spine facet arthropathy. SOFT TISSUES: Aortic atherosclerotic calcifications. No acute abnormality. IMPRESSION: 1. No acute osseous abnormality identified. 2. Scoliosis and lumbar spondylosis 3. Chronic L3 compression deformity, mild. Status post kyphoplasty. Electronically signed by: Waddell Calk MD 04/10/2024 06:13 AM EST RP Workstation: [id number]   DG Knee Complete 4 Views Left Result Date: 04/10/2024 EXAM: 4 VIEW(S) XRAY OF THE LEFT KNEE 04/10/2024 06:07:00 AM COMPARISON: 01/09/2023 CLINICAL HISTORY: fall FINDINGS: BONES AND JOINTS: No acute fracture. No focal osseous lesion. No joint dislocation. No significant joint effusion. Severe medial compartment joint space narrowing with associated subchondral sclerosis and osteophytosis, with essentially unchanged appearance. Relatively preserved lateral and patellofemoral compartment joint spaces, with mild osteophytes. SOFT TISSUES: The soft tissues are unremarkable. IMPRESSION: 1. No evidence of acute traumatic injury. 2. Severe medial compartment osteoarthritis with marked joint space narrowing, subchondral sclerosis, and osteophytosis, unchanged. Electronically signed by: Waddell Calk MD 04/10/2024 06:10 AM EST RP Workstation: [id number]     The patient is able to demonstrate excellent range of motion with 5/5 strength through both hips bilaterally.  She has limitation in use of the knees bilaterally 1 due to previous surgery on the right knee due to arthritis in the left but otherwise looks very well.    Return precautions and treatment recommendations and follow-up  discussed with the patient who is agreeable with the plan.     Dicky Anes, MD 04/10/24 [id number]

## 2024-04-16 DIAGNOSIS — Z8781 Personal history of (healed) traumatic fracture: Secondary | ICD-10-CM | POA: Diagnosis not present

## 2024-04-16 DIAGNOSIS — I10 Essential (primary) hypertension: Secondary | ICD-10-CM | POA: Diagnosis not present

## 2024-04-16 DIAGNOSIS — M810 Age-related osteoporosis without current pathological fracture: Secondary | ICD-10-CM | POA: Diagnosis not present

## 2024-04-16 DIAGNOSIS — Z9181 History of falling: Secondary | ICD-10-CM | POA: Diagnosis not present

## 2024-04-16 DIAGNOSIS — I739 Peripheral vascular disease, unspecified: Secondary | ICD-10-CM | POA: Diagnosis not present

## 2024-04-16 DIAGNOSIS — R609 Edema, unspecified: Secondary | ICD-10-CM | POA: Diagnosis not present

## 2024-04-16 DIAGNOSIS — E039 Hypothyroidism, unspecified: Secondary | ICD-10-CM | POA: Diagnosis not present

## 2024-04-16 DIAGNOSIS — K580 Irritable bowel syndrome with diarrhea: Secondary | ICD-10-CM | POA: Diagnosis not present

## 2024-04-16 DIAGNOSIS — E782 Mixed hyperlipidemia: Secondary | ICD-10-CM | POA: Diagnosis not present

## 2024-04-16 DIAGNOSIS — G309 Alzheimer's disease, unspecified: Secondary | ICD-10-CM | POA: Diagnosis not present

## 2024-04-16 DIAGNOSIS — F0280 Dementia in other diseases classified elsewhere without behavioral disturbance: Secondary | ICD-10-CM | POA: Diagnosis not present

## 2024-04-21 DIAGNOSIS — K2100 Gastro-esophageal reflux disease with esophagitis, without bleeding: Secondary | ICD-10-CM | POA: Diagnosis not present

## 2024-04-21 DIAGNOSIS — E782 Mixed hyperlipidemia: Secondary | ICD-10-CM | POA: Diagnosis not present

## 2024-04-21 DIAGNOSIS — I10 Essential (primary) hypertension: Secondary | ICD-10-CM | POA: Diagnosis not present

## 2024-04-21 DIAGNOSIS — M810 Age-related osteoporosis without current pathological fracture: Secondary | ICD-10-CM | POA: Diagnosis not present

## 2024-05-03 DIAGNOSIS — H1031 Unspecified acute conjunctivitis, right eye: Secondary | ICD-10-CM | POA: Diagnosis not present

## 2024-05-07 DIAGNOSIS — L821 Other seborrheic keratosis: Secondary | ICD-10-CM | POA: Diagnosis not present

## 2024-05-07 DIAGNOSIS — Z85828 Personal history of other malignant neoplasm of skin: Secondary | ICD-10-CM | POA: Diagnosis not present

## 2024-05-07 DIAGNOSIS — D2262 Melanocytic nevi of left upper limb, including shoulder: Secondary | ICD-10-CM | POA: Diagnosis not present

## 2024-05-07 DIAGNOSIS — S60512A Abrasion of left hand, initial encounter: Secondary | ICD-10-CM | POA: Diagnosis not present

## 2024-05-07 DIAGNOSIS — L304 Erythema intertrigo: Secondary | ICD-10-CM | POA: Diagnosis not present
